# Patient Record
Sex: Female | Born: 1975 | Race: Black or African American | Hispanic: No | Marital: Single | Smoking: Never smoker
Health system: Southern US, Community
[De-identification: ages and names within clinical notes are randomized; demographics above are authoritative.]

## PROBLEM LIST (undated history)

## (undated) ENCOUNTER — Inpatient Hospital Stay (HOSPITAL_COMMUNITY): Payer: Self-pay

## (undated) DIAGNOSIS — M8580 Other specified disorders of bone density and structure, unspecified site: Secondary | ICD-10-CM

## (undated) DIAGNOSIS — Z98891 History of uterine scar from previous surgery: Secondary | ICD-10-CM

## (undated) DIAGNOSIS — J45909 Unspecified asthma, uncomplicated: Secondary | ICD-10-CM

## (undated) DIAGNOSIS — O4400 Placenta previa specified as without hemorrhage, unspecified trimester: Secondary | ICD-10-CM

## (undated) DIAGNOSIS — E559 Vitamin D deficiency, unspecified: Secondary | ICD-10-CM

## (undated) DIAGNOSIS — D649 Anemia, unspecified: Secondary | ICD-10-CM

## (undated) HISTORY — PX: DILATION AND CURETTAGE OF UTERUS: SHX78

---

## 2007-03-03 DIAGNOSIS — O1490 Unspecified pre-eclampsia, unspecified trimester: Secondary | ICD-10-CM

## 2008-09-10 ENCOUNTER — Ambulatory Visit: Payer: Self-pay | Admitting: Diagnostic Radiology

## 2008-09-10 ENCOUNTER — Emergency Department (HOSPITAL_BASED_OUTPATIENT_CLINIC_OR_DEPARTMENT_OTHER): Admission: EM | Admit: 2008-09-10 | Discharge: 2008-09-11 | Payer: Self-pay | Admitting: Emergency Medicine

## 2008-09-15 ENCOUNTER — Emergency Department (HOSPITAL_BASED_OUTPATIENT_CLINIC_OR_DEPARTMENT_OTHER): Admission: EM | Admit: 2008-09-15 | Discharge: 2008-09-15 | Payer: Self-pay | Admitting: Emergency Medicine

## 2008-09-15 ENCOUNTER — Ambulatory Visit: Payer: Self-pay | Admitting: Diagnostic Radiology

## 2009-04-23 ENCOUNTER — Ambulatory Visit (HOSPITAL_COMMUNITY): Admission: RE | Admit: 2009-04-23 | Discharge: 2009-04-23 | Payer: Self-pay | Admitting: Obstetrics and Gynecology

## 2010-06-08 LAB — GC/CHLAMYDIA PROBE AMP, GENITAL: GC Probe Amp, Genital: NEGATIVE

## 2010-06-08 LAB — CBC
Hemoglobin: 13.3 g/dL (ref 12.0–15.0)
MCHC: 34.1 g/dL (ref 30.0–36.0)
Platelets: 255 10*3/uL (ref 150–400)
RBC: 4.46 MIL/uL (ref 3.87–5.11)
RDW: 13.2 % (ref 11.5–15.5)

## 2010-06-08 LAB — BASIC METABOLIC PANEL
BUN: 13 mg/dL (ref 6–23)
CO2: 27 mEq/L (ref 19–32)
Chloride: 105 mEq/L (ref 96–112)
GFR calc Af Amer: >60 mL/min (ref >60)
Potassium: 3.5 mEq/L (ref 3.5–5.1)

## 2010-06-08 LAB — WET PREP, GENITAL
Trich, Wet Prep: NONE SEEN
Yeast Wet Prep HPF POC: NONE SEEN

## 2010-06-08 LAB — URINALYSIS, ROUTINE W REFLEX MICROSCOPIC
Glucose, UA: NEGATIVE mg/dL
Hgb urine dipstick: NEGATIVE
Protein, ur: NEGATIVE mg/dL
pH: 7.5 (ref 5.0–8.0)

## 2010-06-08 LAB — URINE MICROSCOPIC-ADD ON

## 2010-06-08 LAB — DIFFERENTIAL
Basophils Relative: 0 % (ref 0–1)
Lymphocytes Relative: 14 % (ref 12–46)
Monocytes Relative: 6 % (ref 3–12)
Neutrophils Relative %: 80 % — ABNORMAL HIGH (ref 43–77)

## 2011-08-26 LAB — OB RESULTS CONSOLE RPR: RPR: NONREACTIVE

## 2011-09-09 LAB — OB RESULTS CONSOLE GC/CHLAMYDIA: Chlamydia: NEGATIVE

## 2011-09-16 ENCOUNTER — Encounter (HOSPITAL_BASED_OUTPATIENT_CLINIC_OR_DEPARTMENT_OTHER): Payer: Self-pay | Admitting: Emergency Medicine

## 2011-09-16 ENCOUNTER — Emergency Department (HOSPITAL_BASED_OUTPATIENT_CLINIC_OR_DEPARTMENT_OTHER)
Admission: EM | Admit: 2011-09-16 | Discharge: 2011-09-17 | Disposition: A | Discharge status: Home / Self Care | Payer: Medicaid Other | Attending: Emergency Medicine | Admitting: Emergency Medicine

## 2011-09-16 DIAGNOSIS — O2690 Pregnancy related conditions, unspecified, unspecified trimester: Secondary | ICD-10-CM | POA: Insufficient documentation

## 2011-09-16 DIAGNOSIS — R109 Unspecified abdominal pain: Secondary | ICD-10-CM

## 2011-09-16 DIAGNOSIS — E860 Dehydration: Secondary | ICD-10-CM | POA: Insufficient documentation

## 2011-09-16 DIAGNOSIS — Z3490 Encounter for supervision of normal pregnancy, unspecified, unspecified trimester: Secondary | ICD-10-CM

## 2011-09-16 LAB — URINALYSIS, ROUTINE W REFLEX MICROSCOPIC
Bilirubin Urine: NEGATIVE
Leukocytes, UA: NEGATIVE
Nitrite: NEGATIVE
Protein, ur: NEGATIVE mg/dL

## 2011-09-16 LAB — CBC WITH DIFFERENTIAL/PLATELET
Basophils Absolute: 0 10*3/uL (ref 0.0–0.1)
Lymphocytes Relative: 19 % (ref 12–46)
MCHC: 34.9 g/dL (ref 30.0–36.0)
Neutrophils Relative %: 73 % (ref 43–77)
RDW: 14.2 % (ref 11.5–15.5)

## 2011-09-16 LAB — BASIC METABOLIC PANEL
Creatinine, Ser: 0.6 mg/dL (ref 0.50–1.10)
GFR calc Af Amer: >90 mL/min (ref >90)
Glucose, Bld: 90 mg/dL (ref 70–99)
Potassium: 4 mEq/L (ref 3.5–5.1)
Sodium: 136 mEq/L (ref 135–145)

## 2011-09-16 MED ORDER — SODIUM CHLORIDE 0.9 % IV BOLUS (SEPSIS)
1000.0000 mL | Freq: Once | INTRAVENOUS | Status: AC
  Administered 2011-09-16: 1000 mL via INTRAVENOUS

## 2011-09-16 MED ORDER — ACETAMINOPHEN 325 MG PO TABS
650.0000 mg | ORAL_TABLET | Freq: Once | ORAL | Status: AC
Start: 2011-09-16 — End: 2011-09-16
  Administered 2011-09-16: 650 mg via ORAL
  Filled 2011-09-16: qty 2

## 2011-09-16 NOTE — ED Notes (Signed)
MD at bedside. 

## 2011-09-16 NOTE — ED Notes (Addendum)
Pt c/o lower abd pain for few days, no urinary sxs noted. Pt states that she has had some spotting in the past but no spotting today. Pt denies any sexual intercourse. MD at bs to perform bs ultrasound.

## 2011-09-16 NOTE — ED Notes (Signed)
rec'd call from [place], RN at [facility] who suggested to continue to monitor pt on [facility] monitor and intermittently check for [facility]. Rapid response RN will continue to monitor and call with any changes.

## 2011-09-16 NOTE — ED Notes (Signed)
Pt c/o suprapubic pain. Pt has hx of placenta previa.

## 2011-09-16 NOTE — ED Notes (Signed)
Pt states she is 20 weeks and 1 day pregnant. Pt states she has been having this pain intermittently during her pregnancy and has been placed on pelvic rest by her OB. Pt state pain onset today started x 2 hours ago.

## 2011-09-16 NOTE — ED Notes (Signed)
Pt placed on fetal monitor, call placed to Lynden Ang, RN rapid response nurse at [facility] hospital who states they will monitor pt and notify of any changes.

## 2011-09-16 NOTE — ED Provider Notes (Addendum)
History     CSN: [id number]  Arrival date & time 09/16/11  2047   First MD Initiated Contact with Patient 09/16/11 2111      Chief Complaint  Patient presents with  . Abdominal Pain    (Consider location/radiation/quality/duration/timing/severity/associated sxs/prior treatment) Patient is a 36 y.o. female presenting with abdominal pain. The history is provided by the patient.  Abdominal Pain The primary symptoms of the illness include abdominal pain. The primary symptoms of the illness do not include fever, nausea or vomiting. Primary symptoms comment: Patient is [redacted] weeks pregnant with a history of placenta previa and developed severe intermittent suprapubic pain tonight for the last 3 hours. The current episode started 3 to 5 hours ago. The onset of the illness was sudden. The problem has not changed since onset. The abdominal pain is located in the suprapubic region. The abdominal pain does not radiate. The severity of the abdominal pain is 9/10. The abdominal pain is relieved by nothing. Exacerbated by: nothing.  Associated with: started while walking. Symptoms associated with the illness do not include urgency, frequency or back pain.    Past Medical History  Diagnosis Date  . Placenta previa     Past Surgical History  Procedure Date  . Dilation and curettage of uterus     No family history on file.  History  Substance Use Topics  . Smoking status: Never Smoker   . Smokeless tobacco: Not on file  . Alcohol Use: No    OB History    Grav Para Term Preterm Abortions TAB SAB Ect Mult Living   1               Review of Systems  Constitutional: Negative for fever.  Gastrointestinal: Positive for abdominal pain. Negative for nausea and vomiting.  Genitourinary: Negative for urgency and frequency.  Musculoskeletal: Negative for back pain.  All other systems reviewed and are negative.    Allergies  Morphine and related  Home Medications   Current Outpatient Rx    Name Route Sig Dispense Refill  . FOLIC ACID 400 MCG PO TABS Oral Take 400 mcg by mouth daily.    Marland Kitchen PRENATAL MULTIVITAMIN CH Oral Take 1 tablet by mouth daily.      BP 117/64  Pulse 84  Temp 98.1 F (36.7 C) (Oral)  Resp 18  SpO2 100%  LMP 04/28/2011  Physical Exam  Nursing note and vitals reviewed. Constitutional: She is oriented to person, place, and time. She appears well-developed and well-nourished. She appears distressed.  HENT:  Head: Normocephalic and atraumatic.  Eyes: EOM are normal. Pupils are equal, round, and reactive to light.  Cardiovascular: Normal rate, regular rhythm, normal heart sounds and intact distal pulses.  Exam reveals no friction rub.   No murmur heard. Pulmonary/Chest: Effort normal and breath sounds normal. She has no wheezes. She has no rales.  Abdominal: Soft. Bowel sounds are normal. She exhibits no distension. There is tenderness. There is guarding. There is no rebound.       Gravid uterus midway to the umbilicus with tenderness in the suprapubic area  Musculoskeletal: Normal range of motion. She exhibits no tenderness.       No edema  Neurological: She is alert and oriented to person, place, and time. No cranial nerve deficit.  Skin: Skin is warm and dry. No rash noted.  Psychiatric: She has a normal mood and affect. Her behavior is normal.    ED Course  Procedures (including critical care time)  Labs Reviewed  CBC WITH DIFFERENTIAL - Abnormal; Notable for the following:    WBC 13.4 (*)     RBC 3.67 (*)     Hemoglobin 10.9 (*)     HCT 31.2 (*)     Neutro Abs 9.9 (*)     All other components within normal limits  URINALYSIS, ROUTINE W REFLEX MICROSCOPIC  BASIC METABOLIC PANEL   No results found.   1. Abdominal  pain, other specified site   2. Pregnancy   3. Dehydration     EMERGENCY DEPARTMENT [place] PREGNANCY "Study: Limited Ultrasound of the Pelvis"  INDICATIONS:Pregnancy(required) and Pelvic pain Multiple views of the uterus  and pelvic cavity are obtained with a multi-frequency probe.  APPROACH:Transabdominal   PERFORMED BY: Myself  IMAGES ARCHIVED?: No  LIMITATIONS: none  PREGNANCY FREE FLUID: None  PREGNANCY UTERUS FINDINGS:Uterus enlarged ADNEXAL FINDINGS:  PREGNANCY FINDINGS: Fetal heart activity seen  INTERPRETATION: Viable intrauterine pregnancy  GESTATIONAL AGE, ESTIMATE: 20w  FETAL HEART RATE: 167  COMMENT(Estimate of Gestational Age):       MDM   Patient who is [redacted] weeks pregnant and has a history of placenta previa. She had mild bleeding yesterday but today while taking a walk she had intermittent contracting type pain in her lower abdomen. This has been ongoing now for 3 hours and is not improving. On exam patient appears uncomfortable. UA without signs of infection. Bedside ultrasound with normal fetal heart rates of 167 in fetal movement. Patient is O+. Will speak with her OB/GYN for further recommendations patient was started on IV fluids.  10:09 PM Spoke with ob and wanted to do fluids and supportive care.  Low concern due to no ctx on toco.  Pt had normal BMP, UA, and stable Hb on CBC.  11:44 PM Pt feeling better and pain has improved.  [facility] confirmed no contractions on toco.  Pt to f/u with OB in am.    Gwyneth Sprout, MD 09/16/11 2210  Gwyneth Sprout, MD 09/16/11 2345  Gwyneth Sprout, MD 09/16/11 2346

## 2011-11-04 ENCOUNTER — Encounter (HOSPITAL_COMMUNITY): Payer: Self-pay | Admitting: *Deleted

## 2011-11-04 ENCOUNTER — Inpatient Hospital Stay (HOSPITAL_COMMUNITY): Payer: Medicaid Other

## 2011-11-04 ENCOUNTER — Inpatient Hospital Stay (HOSPITAL_COMMUNITY)
Admission: AD | Admit: 2011-11-04 | Discharge: 2011-11-05 | DRG: 781 | Disposition: A | Discharge status: Home / Self Care | Payer: Medicaid Other | Source: Ambulatory Visit | Attending: Obstetrics and Gynecology | Admitting: Obstetrics and Gynecology

## 2011-11-04 DIAGNOSIS — O4400 Placenta previa specified as without hemorrhage, unspecified trimester: Secondary | ICD-10-CM | POA: Diagnosis present

## 2011-11-04 DIAGNOSIS — O99019 Anemia complicating pregnancy, unspecified trimester: Secondary | ICD-10-CM | POA: Diagnosis present

## 2011-11-04 DIAGNOSIS — O4700 False labor before 37 completed weeks of gestation, unspecified trimester: Secondary | ICD-10-CM | POA: Diagnosis present

## 2011-11-04 DIAGNOSIS — D649 Anemia, unspecified: Secondary | ICD-10-CM | POA: Diagnosis present

## 2011-11-04 HISTORY — DX: Unspecified asthma, uncomplicated: J45.909

## 2011-11-04 HISTORY — DX: Complete placenta previa nos or without hemorrhage, unspecified trimester: O44.00

## 2011-11-04 HISTORY — DX: Vitamin D deficiency, unspecified: E55.9

## 2011-11-04 LAB — TYPE AND SCREEN: ABO/RH(D): O POS

## 2011-11-04 LAB — URINALYSIS, ROUTINE W REFLEX MICROSCOPIC
Glucose, UA: NEGATIVE mg/dL
Leukocytes, UA: NEGATIVE
Nitrite: NEGATIVE
Protein, ur: NEGATIVE mg/dL
Specific Gravity, Urine: >1.03 — ABNORMAL HIGH (ref 1.005–1.030)
pH: 6 (ref 5.0–8.0)

## 2011-11-04 LAB — CBC
HCT: 34 % — ABNORMAL LOW (ref 36.0–46.0)
Platelets: 300 10*3/uL (ref 150–400)
WBC: 12.6 10*3/uL — ABNORMAL HIGH (ref 4.0–10.5)

## 2011-11-04 LAB — WET PREP, GENITAL: Clue Cells Wet Prep HPF POC: NONE SEEN

## 2011-11-04 LAB — ABO/RH: ABO/RH(D): O POS

## 2011-11-04 MED ORDER — SODIUM CHLORIDE 0.9 % IV SOLN: INTRAVENOUS | Status: DC

## 2011-11-04 MED ORDER — PRENATAL MULTIVITAMIN CH: 1.0000 | ORAL_TABLET | Freq: Every day | ORAL | Status: DC

## 2011-11-04 MED ORDER — DOCUSATE SODIUM 100 MG PO CAPS: 100.0000 mg | ORAL_CAPSULE | Freq: Every day | ORAL | Status: DC

## 2011-11-04 MED ORDER — TERBUTALINE SULFATE 1 MG/ML IJ SOLN
0.2500 mg | Freq: Once | INTRAMUSCULAR | Status: AC
  Administered 2011-11-04: 0.25 mg via SUBCUTANEOUS
  Filled 2011-11-04: qty 1

## 2011-11-04 MED ORDER — ACETAMINOPHEN 325 MG PO TABS: 650.0000 mg | ORAL_TABLET | ORAL | Status: DC | PRN

## 2011-11-04 MED ORDER — BETAMETHASONE SOD PHOS & ACET 6 (3-3) MG/ML IJ SUSP
12.0000 mg | INTRAMUSCULAR | Status: AC
  Administered 2011-11-04 – 2011-11-05 (×2): 12 mg via INTRAMUSCULAR
  Filled 2011-11-04 (×2): qty 2

## 2011-11-04 MED ORDER — ZOLPIDEM TARTRATE 5 MG PO TABS: 5.0000 mg | ORAL_TABLET | Freq: Every evening | ORAL | Status: DC | PRN

## 2011-11-04 MED ORDER — CALCIUM CARBONATE ANTACID 500 MG PO CHEW: 2.0000 | CHEWABLE_TABLET | ORAL | Status: DC | PRN

## 2011-11-04 MED ORDER — LACTATED RINGERS IV SOLN
INTRAVENOUS | Status: DC
  Administered 2011-11-04 – 2011-11-05 (×2): via INTRAVENOUS

## 2011-11-04 NOTE — Progress Notes (Signed)
Patient ID: [facility], female   DOB: 01-May-1975, 36 y.o.   MRN: [id number] 27 1/7 weeks with Complete Previa S: Comfortable now No contractions reported. No bleeding or LOF. Good FM O BP 116/64  Pulse 87  Temp 98.4 F (36.9 C) (Oral)  Resp 18  Ht 5\' 2"  (1.575 m)  Wt 74.844 kg (165 lb)  BMI 30.18 kg/m2  LMP 04/28/2011  FHT 120-130 with BTBV 5-25, 10x10 accels, rare mild variable decel to 120 Contractions rare with occ UI. A: Complete Previa at 27+ weeks. Preterm contractions with pos FFN No bleeding P: Monitor in-house until BMZ completed Tocolysis as needed.

## 2011-11-04 NOTE — Plan of Care (Signed)
Problem: Consults Goal: Birthing Suites Patient Information Press F2 to bring up selections list   Pt < [redacted] weeks EGA     

## 2011-11-04 NOTE — MAU Note (Signed)
Pt G3 P1 at 27.1wks with complete previa, possible acreta-has U/S on Friday.  Cramping this evening and pinkish spotting.

## 2011-11-04 NOTE — Progress Notes (Signed)
FFN obtained along with a wet prep.

## 2011-11-04 NOTE — H&P (Addendum)
Chief complaint: Contractions  History of present illness: This is a 36 year old G4 P1 0-1 at 74 and 1 who presents with regular uterine contractions for the past 3 hours. Patient notes no leaking fluid, no vaginal bleeding, active fetal movements. Patient states contractions began around midnight tonight and have been coming about every 5 minutes. Patient states pain from contractions is a 7-8/10. Patient does note some light pink tinged to her normal vaginal discharge. This has been occurring for the past several weeks.  Prenatal issues: -Placenta previa. Diagnosed at anatomy scan, followup ultrasound at 24 weeks, done for bleeding, revealed persistent placenta previa with clots at the os and some increased vascularity done at the level of the cervix. Followup ultrasounds was planned for later this week. Patient has been on modified bedrest and pelvic rest and states she has been compliant with this. 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  Medications: Prenatal vitamin  Allergies: Morphine  PE:  Filed Vitals:   11/04/11 0228  BP: 112/75  Pulse: 81  Temp: 96.8 F (36 C)  TempSrc: Oral  Resp: 20  Height: 5\' 2"  (1.575 m)  Weight: 74.844 kg (165 lb)   Cardiovascular: Regular rate and rhythm Pulmonary: Clear to auscultation Back: No costovertebral angle tenderness Abdomen: Gravid, no fundal tenderness, no right upper quadrant pain GU: Gentle speculum exam revealed cervix closed and long, scant white vaginal discharge, no digital exam performed Lower extremities: No edema, nontender  Toco: Irritability versus contractions every 4-5 minutes Fetal heart: 140s, +10 x 10 accelerations, no decelerations, moderate variability   UA: nit neg, ketones neg, leuks neg, sp grav  >1.030 CBC    Component Value Date/Time   WBC 12.6* 11/04/2011 0250   RBC 3.87 11/04/2011 0250   HGB 11.2* 11/04/2011 0250   HCT 34.0* 11/04/2011 0250   PLT 300 11/04/2011 0250   MCV 87.9 11/04/2011 0250   MCH 28.9 11/04/2011 0250   MCHC 32.9 11/04/2011 0250   RDW 14.4 11/04/2011 0250   LYMPHSABS 2.5 09/16/2011 2136   MONOABS 0.9 09/16/2011 2136   EOSABS 0.1 09/16/2011 2136   BASOSABS 0.0 09/16/2011 2136    wet prep, UCx, FFN pending  U/s: pending.  Assessment and plan: Is a 36 year old G4 P10 to 1 at 27 weeks and 1 day presenting with contractions and history concerning for placenta previa with possible accreta. - Contractions. Patient with history of term delivery, no history of cervical surgery. Patient at low risk for preterm labor. Will check fetal fibronectin. Given specific gravity of urine, patient likely dehydrated. Will see how she responds to IV fluids. Given symptomatic contractions will give one dose of terbutaline for comfort now. Will also assess cervical length by ultrasound. Patient has not yet had betamethasone. If evaluation concerning for preterm labor will likely keep inpatient for betamethasone and monitoring. - Concern for placenta previa with possible accreta. Has not been assessed in about 4 weeks. Will repeat ultrasound now. Patient's past obstetric history is not concerning for high risk of accreta or previa. -Fetal well being. Guarded due to early gestational age although fetal testing at current is reassuring.  Natalie Ross A. 11/04/2011 3:31 AM   MAU course: Pt received single dose of terbutaline and IV fluids. Contractions spaced then stopped and pt no longer feeling  contractions. U/s done (final report pending) showing persistent placenta previa but cvx 4 cm long (abdominal only). FFN returned as positive.   Given painful contractions (now gone) and FFN positive, will admit pt for further observation and administration of BMZ. If no further ctx on monitor and no c/o  complaint of bleeding, can consider d/c in 24 hrs after receiving her 2nd dose BMZ..If pt starts contracting again will likely plan nifedipine tocolysis. If bleeding heavily or delivery imminent will plan Magnesium for neuroprotection.    Persistent placenta previa. No significant bleeding at this time.  GBS cx not done as MOD will be c/s at this time  Plan GTT in 1 wk  Frederich Montilla A. 11/04/2011 6:56 AM

## 2011-11-05 ENCOUNTER — Encounter (HOSPITAL_COMMUNITY): Payer: Self-pay

## 2011-11-05 DIAGNOSIS — O4400 Placenta previa specified as without hemorrhage, unspecified trimester: Secondary | ICD-10-CM

## 2011-11-05 HISTORY — DX: Complete placenta previa nos or without hemorrhage, unspecified trimester: O44.00

## 2011-11-05 LAB — URINE CULTURE: Colony Count: 25000

## 2011-11-05 LAB — CBC
Platelets: 254 10*3/uL (ref 150–400)
RDW: 14.5 % (ref 11.5–15.5)
WBC: 15.4 10*3/uL — ABNORMAL HIGH (ref 4.0–10.5)

## 2011-11-05 NOTE — Progress Notes (Signed)
Patient ID: [facility], female   DOB: October 23, 1975, 36 y.o.   MRN: [id number] HD #1 S: Occ contractions in middle of night. No bleeding. Good FM. No LOF. No CP or SOB. No history of PTL or PTD  O: BP 116/78  Pulse 86  Temp 98.1 F (36.7 C) (Oral)  Resp 18  Ht 5\' 2"  (1.575 m)  Wt 74.844 kg (165 lb)  BMI 30.18 kg/m2  LMP 04/28/2011  NCAT Lungs: CTA CV: RRR ABD: soft, gravid and non tender No CVAT EXT: neg c/c/e Neuro: nonfocal Skin: intact Pelvic : deferred  CBC    Component Value Date/Time   WBC 15.4* 11/05/2011 0440   RBC 3.09* 11/05/2011 0440   HGB 9.1* 11/05/2011 0440   HCT 27.1* 11/05/2011 0440   PLT 254 11/05/2011 0440   MCV 87.7 11/05/2011 0440   MCH 29.4 11/05/2011 0440   MCHC 33.6 11/05/2011 0440   RDW 14.5 11/05/2011 0440   LYMPHSABS 2.5 09/16/2011 2136   MONOABS 0.9 09/16/2011 2136   EOSABS 0.1 09/16/2011 2136   BASOSABS 0.0 09/16/2011 2136    FHT 150s with BTBV 5-25, 10x10 accels and no decels Rare contractions noted. Category 1 tracing  A: 27 2/7 week IUP Complete Previa- no bleeding Preterm contractions with pos FFN - No evidence of PTL. BMZ completed. Anemia  P: Recommend OTC Fe(will give samples tomorrow) DC home Bedrest with BRP PTL precautions Fu with Dr. Cherly Hensen tomorrow. Pt lives within of hospital.

## 2011-12-02 NOTE — Progress Notes (Signed)
Post discharge review completed. 

## 2011-12-05 ENCOUNTER — Encounter (HOSPITAL_COMMUNITY): Payer: Self-pay | Admitting: Obstetrics and Gynecology

## 2011-12-05 ENCOUNTER — Inpatient Hospital Stay (HOSPITAL_COMMUNITY)
Admission: AD | Admit: 2011-12-05 | Discharge: 2011-12-05 | Disposition: A | Discharge status: Home / Self Care | Payer: Medicaid Other | Source: Ambulatory Visit | Attending: Obstetrics and Gynecology | Admitting: Obstetrics and Gynecology

## 2011-12-05 DIAGNOSIS — E860 Dehydration: Secondary | ICD-10-CM | POA: Insufficient documentation

## 2011-12-05 DIAGNOSIS — O4410 Placenta previa with hemorrhage, unspecified trimester: Secondary | ICD-10-CM | POA: Insufficient documentation

## 2011-12-05 DIAGNOSIS — O4400 Placenta previa specified as without hemorrhage, unspecified trimester: Secondary | ICD-10-CM

## 2011-12-05 LAB — URINALYSIS, ROUTINE W REFLEX MICROSCOPIC
Bilirubin Urine: NEGATIVE
Glucose, UA: NEGATIVE mg/dL
Specific Gravity, Urine: >1.03 — ABNORMAL HIGH (ref 1.005–1.030)
pH: 6 (ref 5.0–8.0)

## 2011-12-05 LAB — URINE MICROSCOPIC-ADD ON

## 2011-12-05 NOTE — MAU Note (Signed)
Pt presents to MAU with chief complaint of brown vaginal spotting. Pt is [redacted]w[redacted]d- and has a placenta previa; has been on pelvic rest.

## 2011-12-05 NOTE — MAU Provider Note (Signed)
History   [facility] is a [id number] at [redacted]w[redacted]d with known placenta previa. C/O brown spotting started this am and pelvic pressure. Had run of contractions last night and drank some water, ctx subsided and today she has felt occasional ctx, 1-2 / hr. Minimal water intake today.  Reports good fetal movement. Ongoing pelvic rest since dx.   Chief Complaint  Patient presents with  . Vaginal Bleeding    OB History    Grav Para Term Preterm Abortions TAB SAB Ect Mult Living   4 1   2 1 1   1       Past Medical History  Diagnosis Date  . Placenta previa   . Vitamin d deficiency   . Asthma   . Placenta previa without hemorrhage 11/05/2011    Past Surgical History  Procedure Date  . Dilation and curettage of uterus     Family History  Problem Relation Age of Onset  . Diabetes Father   . Vision loss Father   . Stroke Maternal Aunt   . Cancer Paternal Aunt   . Cancer Paternal Uncle   . Hypertension Maternal Grandmother   . Diabetes Maternal Grandmother   . Heart disease Maternal Grandmother   . Arthritis Maternal Grandmother   . Hypertension Paternal Grandmother   . Diabetes Paternal Grandmother   . Arthritis Paternal Grandmother   . Asthma Paternal Grandfather     History  Substance Use Topics  . Smoking status: Never Smoker   . Smokeless tobacco: Not on file  . Alcohol Use: No    Allergies:  Allergies  Allergen Reactions  . Morphine And Related Other (See Comments)    Pt reported trouble breathing    Prescriptions prior to admission  Medication Sig Dispense Refill  . albuterol (PROVENTIL HFA;VENTOLIN HFA) 108 (90 BASE) MCG/ACT inhaler Inhale 2 puffs into the lungs every 6 (six) hours as needed. Asthma, SOB      . Iron-FA-B Cmp-C-Biot-Probiotic (FUSION PLUS PO) Take 1 tablet by mouth at bedtime.      . Prenatal Vit-Fe Fumarate-FA (PRENATAL MULTIVITAMIN) TABS Take 1 tablet by mouth every morning.        ROS: no N/V, no abdominal pain, no LOF Physical Exam      Physical Exam: Blood pressure 114/62, pulse 88, temperature 98.5 F (36.9 C), temperature source Oral, resp. rate 18, last menstrual period 04/28/2011.  General: NAD Abd: Soft, NT, no ctx palp  Ext: no edema Neuro: DTRs normal Pelvic: SE done, small amount brown/dark red mucous debris cleared w/ fox swab, cervix appears slightly open, no active bleed Gentle digital exam 1-2/60/-2  FHR: 145, moderate variability, no decel's, + acel's TOCO: occasional irritability / small 30-40 sec ctx   ED Course   Results for orders placed during the hospital encounter of 12/05/11 (from the past 24 hour(s))  URINALYSIS, ROUTINE W REFLEX MICROSCOPIC     Status: Abnormal   Collection Time   12/05/11  2:44 PM      Component Value Range   Color, Urine AMBER (*) YELLOW   APPearance CLEAR  CLEAR   Specific Gravity, Urine >1.030 (*) 1.005 - 1.030   pH 6.0  5.0 - 8.0   Glucose, UA NEGATIVE  NEGATIVE mg/dL   Hgb urine dipstick LARGE (*) NEGATIVE   Bilirubin Urine NEGATIVE  NEGATIVE   Ketones, ur NEGATIVE  NEGATIVE mg/dL   Protein, ur NEGATIVE  NEGATIVE mg/dL   Urobilinogen, UA 1.0  0.0 - 1.0 mg/dL   Nitrite  NEGATIVE  NEGATIVE   Leukocytes, UA NEGATIVE  NEGATIVE  URINE MICROSCOPIC-ADD ON     Status: Abnormal   Collection Time   12/05/11  2:44 PM      Component Value Range   Squamous Epithelial / LPF MANY (*) RARE   RBC / HPF TOO NUMEROUS TO COUNT  <3 RBC/hpf   Bacteria, UA MANY (*) RARE   Urine-Other MUCOUS PRESENT     A/P: IUP at [redacted]w[redacted]d, known previa Reassuring fetal status No active bleed Mild dehydration  Will give PO hydration DC home after 1 L water intake F/U w/ scheduled visit 12/11/2011 Continue previa precautions.  Consult Dr. Cherly Hensen, agrees  [facility]  12/05/2011 4:28 PM

## 2011-12-28 ENCOUNTER — Other Ambulatory Visit (HOSPITAL_COMMUNITY): Payer: Self-pay | Admitting: Obstetrics and Gynecology

## 2011-12-28 ENCOUNTER — Encounter (HOSPITAL_COMMUNITY): Payer: Self-pay | Admitting: Pharmacist

## 2011-12-28 DIAGNOSIS — O4400 Placenta previa specified as without hemorrhage, unspecified trimester: Secondary | ICD-10-CM

## 2011-12-30 ENCOUNTER — Other Ambulatory Visit (HOSPITAL_COMMUNITY): Payer: Self-pay | Admitting: Obstetrics and Gynecology

## 2011-12-30 ENCOUNTER — Ambulatory Visit (HOSPITAL_COMMUNITY)
Admission: RE | Admit: 2011-12-30 | Discharge: 2011-12-30 | Disposition: A | Discharge status: Home / Self Care | Payer: Medicaid Other | Source: Ambulatory Visit | Attending: Obstetrics and Gynecology | Admitting: Obstetrics and Gynecology

## 2011-12-30 DIAGNOSIS — O4400 Placenta previa specified as without hemorrhage, unspecified trimester: Secondary | ICD-10-CM

## 2012-01-04 ENCOUNTER — Encounter (HOSPITAL_COMMUNITY)
Admission: RE | Admit: 2012-01-04 | Discharge: 2012-01-04 | Disposition: A | Discharge status: Home / Self Care | Payer: Medicaid Other | Source: Ambulatory Visit | Attending: Obstetrics and Gynecology | Admitting: Obstetrics and Gynecology

## 2012-01-04 ENCOUNTER — Encounter (HOSPITAL_COMMUNITY): Payer: Self-pay

## 2012-01-04 VITALS — BP 119/77 | Ht 62.0 in | Wt 180.0 lb

## 2012-01-04 DIAGNOSIS — O4400 Placenta previa specified as without hemorrhage, unspecified trimester: Secondary | ICD-10-CM

## 2012-01-04 HISTORY — DX: Other specified disorders of bone density and structure, unspecified site: M85.80

## 2012-01-04 LAB — TYPE AND SCREEN
ABO/RH(D): O POS
Antibody Screen: NEGATIVE

## 2012-01-04 LAB — CBC
MCV: 87.1 fL (ref 78.0–100.0)
Platelets: 259 10*3/uL (ref 150–400)
RDW: 14.8 % (ref 11.5–15.5)
WBC: 12.2 10*3/uL — ABNORMAL HIGH (ref 4.0–10.5)

## 2012-01-04 LAB — RPR: RPR Ser Ql: NONREACTIVE

## 2012-01-04 NOTE — Pre-Procedure Instructions (Signed)
4 units PRBCs on hold for patient per order Dana Allan, M.D due to placenta previa and possible accreta. Confirmed with Sharlyne Cai in Blood Bank that Plasma available if needed, but can not be ordered until actually ready to transfuse.

## 2012-01-04 NOTE — Patient Instructions (Addendum)
[address] [facility]  01/04/2012   Your procedure is scheduled on:  01/12/12  Enter through the Main Entrance of [facility] at 6 AM.  Pick up the phone at the desk and dial [phone].   Call this number if you have problems the morning of surgery: [phone]   Remember:   Do not eat food:After Midnight.  Do not drink clear liquids: After Midnight.  Take these medicines the morning of surgery with A SIP OF WATER: NA   Do not wear jewelry, make-up or nail polish.  Do not wear lotions, powders, or perfumes. You may wear deodorant.  Do not shave 48 hours prior to surgery.  Do not bring valuables to the hospital.  Contacts, dentures or bridgework may not be worn into surgery.  Leave suitcase in the car. After surgery it may be brought to your room.  For patients admitted to the hospital, checkout time is 11:00 AM the day of discharge.   Patients discharged the day of surgery will not be allowed to drive home.  Name and phone number of your driver: NA  Special Instructions: Shower using CHG 2 nights before surgery and the night before surgery.  If you shower the day of surgery use CHG.  Use special wash - you have one bottle of CHG for all showers.  You should use approximately 1/3 of the bottle for each shower.   Please read over the following fact sheets that you were given: MRSA Information

## 2012-01-08 ENCOUNTER — Other Ambulatory Visit: Payer: Self-pay | Admitting: Obstetrics and Gynecology

## 2012-01-12 ENCOUNTER — Inpatient Hospital Stay (HOSPITAL_COMMUNITY): Payer: Medicaid Other | Admitting: Anesthesiology

## 2012-01-12 ENCOUNTER — Inpatient Hospital Stay (HOSPITAL_COMMUNITY)
Admission: RE | Admit: 2012-01-12 | Discharge: 2012-01-15 | DRG: 766 | Disposition: A | Discharge status: Home / Self Care | Payer: Medicaid Other | Source: Ambulatory Visit | Attending: Obstetrics and Gynecology | Admitting: Obstetrics and Gynecology

## 2012-01-12 ENCOUNTER — Encounter (HOSPITAL_COMMUNITY): Payer: Self-pay | Admitting: *Deleted

## 2012-01-12 ENCOUNTER — Encounter (HOSPITAL_COMMUNITY)
Admission: RE | Disposition: A | Discharge status: Home / Self Care | Payer: Self-pay | Source: Ambulatory Visit | Attending: Obstetrics and Gynecology

## 2012-01-12 ENCOUNTER — Encounter (HOSPITAL_COMMUNITY): Payer: Self-pay | Admitting: Anesthesiology

## 2012-01-12 DIAGNOSIS — O4410 Placenta previa with hemorrhage, unspecified trimester: Principal | ICD-10-CM | POA: Diagnosis present

## 2012-01-12 DIAGNOSIS — Z98891 History of uterine scar from previous surgery: Secondary | ICD-10-CM

## 2012-01-12 DIAGNOSIS — D649 Anemia, unspecified: Secondary | ICD-10-CM | POA: Diagnosis present

## 2012-01-12 DIAGNOSIS — O4400 Placenta previa specified as without hemorrhage, unspecified trimester: Secondary | ICD-10-CM | POA: Diagnosis present

## 2012-01-12 HISTORY — DX: History of uterine scar from previous surgery: Z98.891

## 2012-01-12 HISTORY — DX: Anemia, unspecified: D64.9

## 2012-01-12 LAB — PREPARE RBC (CROSSMATCH)

## 2012-01-12 SURGERY — Surgical Case
Anesthesia: Spinal | Site: Abdomen | Wound class: Clean Contaminated

## 2012-01-12 MED ORDER — BISACODYL 10 MG RE SUPP: 10.0000 mg | Freq: Every day | RECTAL | Status: DC | PRN

## 2012-01-12 MED ORDER — KETOROLAC TROMETHAMINE 30 MG/ML IJ SOLN: 30.0000 mg | Freq: Four times a day (QID) | INTRAMUSCULAR | Status: AC | PRN

## 2012-01-12 MED ORDER — METHYLERGONOVINE MALEATE 0.2 MG PO TABS: 0.2000 mg | ORAL_TABLET | ORAL | Status: DC | PRN

## 2012-01-12 MED ORDER — FENTANYL CITRATE 0.05 MG/ML IJ SOLN
INTRAMUSCULAR | Status: AC
  Filled 2012-01-12: qty 2

## 2012-01-12 MED ORDER — LACTATED RINGERS IV SOLN: INTRAVENOUS | Status: DC | PRN

## 2012-01-12 MED ORDER — BUPIVACAINE HCL (PF) 0.25 % IJ SOLN
INTRAMUSCULAR | Status: AC
  Filled 2012-01-12: qty 30

## 2012-01-12 MED ORDER — METOCLOPRAMIDE HCL 5 MG/ML IJ SOLN: 10.0000 mg | Freq: Three times a day (TID) | INTRAMUSCULAR | Status: DC | PRN

## 2012-01-12 MED ORDER — SODIUM CHLORIDE 0.9 % IJ SOLN: 3.0000 mL | INTRAMUSCULAR | Status: DC | PRN

## 2012-01-12 MED ORDER — CEFAZOLIN SODIUM-DEXTROSE 2-3 GM-% IV SOLR
2.0000 g | INTRAVENOUS | Status: AC
  Administered 2012-01-12: 2 g via INTRAVENOUS

## 2012-01-12 MED ORDER — SODIUM CHLORIDE 0.9 % IV SOLN
1.0000 ug/kg/h | INTRAVENOUS | Status: DC | PRN
  Filled 2012-01-12: qty 2.5

## 2012-01-12 MED ORDER — MEPERIDINE HCL 25 MG/ML IJ SOLN: 6.2500 mg | INTRAMUSCULAR | Status: DC | PRN

## 2012-01-12 MED ORDER — DIPHENHYDRAMINE HCL 50 MG/ML IJ SOLN: 12.5000 mg | INTRAMUSCULAR | Status: DC | PRN

## 2012-01-12 MED ORDER — NALOXONE HCL 0.4 MG/ML IJ SOLN: 0.4000 mg | INTRAMUSCULAR | Status: DC | PRN

## 2012-01-12 MED ORDER — METHYLERGONOVINE MALEATE 0.2 MG/ML IJ SOLN: 0.2000 mg | INTRAMUSCULAR | Status: DC | PRN

## 2012-01-12 MED ORDER — IBUPROFEN 600 MG PO TABS
600.0000 mg | ORAL_TABLET | Freq: Four times a day (QID) | ORAL | Status: DC
  Administered 2012-01-12 – 2012-01-15 (×12): 600 mg via ORAL
  Filled 2012-01-12 (×12): qty 1

## 2012-01-12 MED ORDER — KETOROLAC TROMETHAMINE 60 MG/2ML IM SOLN
INTRAMUSCULAR | Status: AC
  Administered 2012-01-12: 60 mg via INTRAMUSCULAR
  Filled 2012-01-12: qty 2

## 2012-01-12 MED ORDER — MENTHOL 3 MG MT LOZG: 1.0000 | LOZENGE | OROMUCOSAL | Status: DC | PRN

## 2012-01-12 MED ORDER — SCOPOLAMINE 1 MG/3DAYS TD PT72: 1.0000 | MEDICATED_PATCH | Freq: Once | TRANSDERMAL | Status: DC

## 2012-01-12 MED ORDER — OXYTOCIN 10 UNIT/ML IJ SOLN
40.0000 [IU] | INTRAVENOUS | Status: DC | PRN
  Administered 2012-01-12: 40 [IU] via INTRAVENOUS

## 2012-01-12 MED ORDER — PNEUMOCOCCAL VAC POLYVALENT 25 MCG/0.5ML IJ INJ
0.5000 mL | INJECTION | INTRAMUSCULAR | Status: AC
  Administered 2012-01-13: 0.5 mL via INTRAMUSCULAR
  Filled 2012-01-12: qty 0.5

## 2012-01-12 MED ORDER — CEFAZOLIN SODIUM-DEXTROSE 2-3 GM-% IV SOLR
INTRAVENOUS | Status: AC
  Filled 2012-01-12: qty 50

## 2012-01-12 MED ORDER — ZOLPIDEM TARTRATE 5 MG PO TABS: 5.0000 mg | ORAL_TABLET | Freq: Every evening | ORAL | Status: DC | PRN

## 2012-01-12 MED ORDER — WITCH HAZEL-GLYCERIN EX PADS: 1.0000 "application " | MEDICATED_PAD | CUTANEOUS | Status: DC | PRN

## 2012-01-12 MED ORDER — SODIUM CHLORIDE 0.9 % IV SOLN: 250.0000 mL | INTRAVENOUS | Status: DC

## 2012-01-12 MED ORDER — ONDANSETRON HCL 4 MG/2ML IJ SOLN: 4.0000 mg | INTRAMUSCULAR | Status: DC | PRN

## 2012-01-12 MED ORDER — SODIUM CHLORIDE 0.9 % IJ SOLN
3.0000 mL | Freq: Two times a day (BID) | INTRAMUSCULAR | Status: DC
  Administered 2012-01-13: 3 mL via INTRAVENOUS

## 2012-01-12 MED ORDER — EPHEDRINE 5 MG/ML INJ
INTRAVENOUS | Status: AC
  Filled 2012-01-12: qty 10

## 2012-01-12 MED ORDER — MORPHINE SULFATE (PF) 0.5 MG/ML IJ SOLN
INTRAMUSCULAR | Status: DC | PRN
  Administered 2012-01-12: .2 mg via INTRATHECAL

## 2012-01-12 MED ORDER — ONDANSETRON HCL 4 MG/2ML IJ SOLN: 4.0000 mg | Freq: Three times a day (TID) | INTRAMUSCULAR | Status: DC | PRN

## 2012-01-12 MED ORDER — SIMETHICONE 80 MG PO CHEW
80.0000 mg | CHEWABLE_TABLET | Freq: Three times a day (TID) | ORAL | Status: DC
  Administered 2012-01-12 – 2012-01-15 (×10): 80 mg via ORAL

## 2012-01-12 MED ORDER — FERROUS SULFATE 325 (65 FE) MG PO TABS
325.0000 mg | ORAL_TABLET | Freq: Two times a day (BID) | ORAL | Status: DC
  Administered 2012-01-12 – 2012-01-13 (×2): 325 mg via ORAL
  Filled 2012-01-12 (×2): qty 1

## 2012-01-12 MED ORDER — DIPHENHYDRAMINE HCL 25 MG PO CAPS
25.0000 mg | ORAL_CAPSULE | ORAL | Status: DC | PRN
  Filled 2012-01-12: qty 1

## 2012-01-12 MED ORDER — OXYTOCIN 10 UNIT/ML IJ SOLN
INTRAMUSCULAR | Status: AC
  Filled 2012-01-12: qty 4

## 2012-01-12 MED ORDER — SCOPOLAMINE 1 MG/3DAYS TD PT72
MEDICATED_PATCH | TRANSDERMAL | Status: AC
  Administered 2012-01-12: 1.5 mg via TRANSDERMAL
  Filled 2012-01-12: qty 1

## 2012-01-12 MED ORDER — DIPHENHYDRAMINE HCL 50 MG/ML IJ SOLN: 25.0000 mg | INTRAMUSCULAR | Status: DC | PRN

## 2012-01-12 MED ORDER — FLEET ENEMA 7-19 GM/118ML RE ENEM: 1.0000 | ENEMA | Freq: Every day | RECTAL | Status: DC | PRN

## 2012-01-12 MED ORDER — KETOROLAC TROMETHAMINE 30 MG/ML IJ SOLN: 15.0000 mg | Freq: Once | INTRAMUSCULAR | Status: DC | PRN

## 2012-01-12 MED ORDER — PRENATAL MULTIVITAMIN CH
1.0000 | ORAL_TABLET | Freq: Every day | ORAL | Status: DC
  Administered 2012-01-12 – 2012-01-15 (×4): 1 via ORAL
  Filled 2012-01-12 (×3): qty 1

## 2012-01-12 MED ORDER — PHENYLEPHRINE HCL 10 MG/ML IJ SOLN
INTRAMUSCULAR | Status: DC | PRN
  Administered 2012-01-12: 40 ug via INTRAVENOUS
  Administered 2012-01-12: 80 ug via INTRAVENOUS
  Administered 2012-01-12 (×3): 40 ug via INTRAVENOUS
  Administered 2012-01-12: 80 ug via INTRAVENOUS
  Administered 2012-01-12 (×2): 40 ug via INTRAVENOUS

## 2012-01-12 MED ORDER — ONDANSETRON HCL 4 MG/2ML IJ SOLN
INTRAMUSCULAR | Status: AC
  Filled 2012-01-12: qty 2

## 2012-01-12 MED ORDER — SIMETHICONE 80 MG PO CHEW: 80.0000 mg | CHEWABLE_TABLET | ORAL | Status: DC | PRN

## 2012-01-12 MED ORDER — TETANUS-DIPHTH-ACELL PERTUSSIS 5-2.5-18.5 LF-MCG/0.5 IM SUSP
0.5000 mL | Freq: Once | INTRAMUSCULAR | Status: AC
  Administered 2012-01-13: 0.5 mL via INTRAMUSCULAR
  Filled 2012-01-12: qty 0.5

## 2012-01-12 MED ORDER — ALBUTEROL SULFATE HFA 108 (90 BASE) MCG/ACT IN AERS: 2.0000 | INHALATION_SPRAY | Freq: Four times a day (QID) | RESPIRATORY_TRACT | Status: DC | PRN

## 2012-01-12 MED ORDER — BUPIVACAINE HCL (PF) 0.25 % IJ SOLN
INTRAMUSCULAR | Status: DC | PRN
  Administered 2012-01-12: 6 mL

## 2012-01-12 MED ORDER — BUPIVACAINE IN DEXTROSE 0.75-8.25 % IT SOLN
INTRATHECAL | Status: DC | PRN
  Administered 2012-01-12: 1.4 mL via INTRATHECAL

## 2012-01-12 MED ORDER — OXYCODONE-ACETAMINOPHEN 5-325 MG PO TABS
1.0000 | ORAL_TABLET | ORAL | Status: DC | PRN
  Administered 2012-01-13 – 2012-01-14 (×2): 2 via ORAL
  Administered 2012-01-14: 1 via ORAL
  Administered 2012-01-15: 2 via ORAL
  Filled 2012-01-12 (×2): qty 2
  Filled 2012-01-12: qty 1
  Filled 2012-01-12: qty 2

## 2012-01-12 MED ORDER — LANOLIN HYDROUS EX OINT: 1.0000 "application " | TOPICAL_OINTMENT | CUTANEOUS | Status: DC | PRN

## 2012-01-12 MED ORDER — SENNOSIDES-DOCUSATE SODIUM 8.6-50 MG PO TABS
2.0000 | ORAL_TABLET | Freq: Every day | ORAL | Status: DC
  Administered 2012-01-12 – 2012-01-14 (×3): 2 via ORAL

## 2012-01-12 MED ORDER — PROMETHAZINE HCL 25 MG/ML IJ SOLN: 6.2500 mg | INTRAMUSCULAR | Status: DC | PRN

## 2012-01-12 MED ORDER — FENTANYL CITRATE 0.05 MG/ML IJ SOLN
INTRAMUSCULAR | Status: AC
  Administered 2012-01-12: 50 ug via INTRAVENOUS
  Filled 2012-01-12: qty 2

## 2012-01-12 MED ORDER — MORPHINE SULFATE 0.5 MG/ML IJ SOLN
INTRAMUSCULAR | Status: AC
  Filled 2012-01-12: qty 10

## 2012-01-12 MED ORDER — PHENYLEPHRINE 40 MCG/ML (10ML) SYRINGE FOR IV PUSH (FOR BLOOD PRESSURE SUPPORT)
PREFILLED_SYRINGE | INTRAVENOUS | Status: AC
  Filled 2012-01-12: qty 10

## 2012-01-12 MED ORDER — FENTANYL CITRATE 0.05 MG/ML IJ SOLN
25.0000 ug | INTRAMUSCULAR | Status: DC | PRN
  Administered 2012-01-12: 50 ug via INTRAVENOUS

## 2012-01-12 MED ORDER — ONDANSETRON HCL 4 MG/2ML IJ SOLN
INTRAMUSCULAR | Status: DC | PRN
  Administered 2012-01-12: 4 mg via INTRAVENOUS

## 2012-01-12 MED ORDER — EPHEDRINE SULFATE 50 MG/ML IJ SOLN
INTRAMUSCULAR | Status: DC | PRN
  Administered 2012-01-12: 10 mg via INTRAVENOUS
  Administered 2012-01-12: 15 mg via INTRAVENOUS
  Administered 2012-01-12: 10 mg via INTRAVENOUS
  Administered 2012-01-12: 5 mg via INTRAVENOUS
  Administered 2012-01-12: 10 mg via INTRAVENOUS

## 2012-01-12 MED ORDER — SCOPOLAMINE 1 MG/3DAYS TD PT72
1.0000 | MEDICATED_PATCH | Freq: Once | TRANSDERMAL | Status: DC
  Administered 2012-01-12: 1.5 mg via TRANSDERMAL

## 2012-01-12 MED ORDER — DIBUCAINE 1 % RE OINT: 1.0000 "application " | TOPICAL_OINTMENT | RECTAL | Status: DC | PRN

## 2012-01-12 MED ORDER — FENTANYL CITRATE 0.05 MG/ML IJ SOLN
INTRAMUSCULAR | Status: DC | PRN
  Administered 2012-01-12: 12.5 ug via INTRATHECAL

## 2012-01-12 MED ORDER — ONDANSETRON HCL 4 MG PO TABS: 4.0000 mg | ORAL_TABLET | ORAL | Status: DC | PRN

## 2012-01-12 MED ORDER — LACTATED RINGERS IV SOLN
Freq: Once | INTRAVENOUS | Status: AC
  Administered 2012-01-12 (×3): via INTRAVENOUS

## 2012-01-12 MED ORDER — DIPHENHYDRAMINE HCL 25 MG PO CAPS: 25.0000 mg | ORAL_CAPSULE | Freq: Four times a day (QID) | ORAL | Status: DC | PRN

## 2012-01-12 MED ORDER — KETOROLAC TROMETHAMINE 60 MG/2ML IM SOLN
60.0000 mg | Freq: Once | INTRAMUSCULAR | Status: AC | PRN
  Administered 2012-01-12: 60 mg via INTRAMUSCULAR

## 2012-01-12 MED ORDER — CEFAZOLIN SODIUM 1-5 GM-% IV SOLN
1.0000 g | Freq: Three times a day (TID) | INTRAVENOUS | Status: AC
  Administered 2012-01-12 (×2): 1 g via INTRAVENOUS
  Filled 2012-01-12 (×2): qty 50

## 2012-01-12 MED ORDER — OXYTOCIN 40 UNITS IN LACTATED RINGERS INFUSION - SIMPLE MED: 62.5000 mL/h | INTRAVENOUS | Status: AC

## 2012-01-12 SURGICAL SUPPLY — 44 items
BARRIER ADHS 3X4 INTERCEED (GAUZE/BANDAGES/DRESSINGS) ×2 IMPLANT
BENZOIN TINCTURE PRP APPL 2/3 (GAUZE/BANDAGES/DRESSINGS) IMPLANT
CLOTH BEACON ORANGE TIMEOUT ST (SAFETY) ×2 IMPLANT
CONTAINER PREFILL 10% NBF 15ML (MISCELLANEOUS) IMPLANT
DRAPE SURG 17X23 STRL (DRAPES) IMPLANT
DRESSING TELFA 8X3 (GAUZE/BANDAGES/DRESSINGS) ×2 IMPLANT
DRSG COVADERM 4X10 (GAUZE/BANDAGES/DRESSINGS) ×4 IMPLANT
DURAPREP 26ML APPLICATOR (WOUND CARE) ×2 IMPLANT
ELECT REM PT RETURN 9FT ADLT (ELECTROSURGICAL) ×2
ELECTRODE REM PT RTRN 9FT ADLT (ELECTROSURGICAL) ×1 IMPLANT
EXTRACTOR VACUUM KIWI (MISCELLANEOUS) IMPLANT
EXTRACTOR VACUUM M CUP 4 TUBE (SUCTIONS) IMPLANT
GAUZE SPONGE 4X4 12PLY STRL LF (GAUZE/BANDAGES/DRESSINGS) ×4 IMPLANT
GLOVE BIO SURGEON STRL SZ 6.5 (GLOVE) ×2 IMPLANT
GLOVE BIOGEL PI IND STRL 7.0 (GLOVE) ×2 IMPLANT
GLOVE BIOGEL PI INDICATOR 7.0 (GLOVE) ×2
GOWN PREVENTION PLUS LG XLONG (DISPOSABLE) ×6 IMPLANT
KIT ABG SYR 3ML LUER SLIP (SYRINGE) IMPLANT
NEEDLE HYPO 25X1 1.5 SAFETY (NEEDLE) ×2 IMPLANT
NEEDLE HYPO 25X5/8 SAFETYGLIDE (NEEDLE) ×2 IMPLANT
NS IRRIG 1000ML POUR BTL (IV SOLUTION) ×2 IMPLANT
PACK C SECTION WH (CUSTOM PROCEDURE TRAY) ×2 IMPLANT
PAD ABD 7.5X8 STRL (GAUZE/BANDAGES/DRESSINGS) ×2 IMPLANT
PAD OB MATERNITY 4.3X12.25 (PERSONAL CARE ITEMS) ×2 IMPLANT
RTRCTR C-SECT PINK 25CM LRG (MISCELLANEOUS) IMPLANT
SLEEVE SCD COMPRESS KNEE MED (MISCELLANEOUS) IMPLANT
SPONGE GAUZE 4X4 12PLY (GAUZE/BANDAGES/DRESSINGS) ×2 IMPLANT
STAPLER VISISTAT 35W (STAPLE) IMPLANT
STRIP CLOSURE SKIN 1/2X4 (GAUZE/BANDAGES/DRESSINGS) IMPLANT
SUT CHROMIC GUT AB #0 18 (SUTURE) IMPLANT
SUT MNCRL 0 VIOLET CTX 36 (SUTURE) ×3 IMPLANT
SUT MON AB 4-0 PS1 27 (SUTURE) IMPLANT
SUT MONOCRYL 0 CTX 36 (SUTURE) ×3
SUT PLAIN 2 0 (SUTURE)
SUT PLAIN ABS 2-0 CT1 27XMFL (SUTURE) IMPLANT
SUT VIC AB 0 CT1 27 (SUTURE) ×2
SUT VIC AB 0 CT1 27XBRD ANBCTR (SUTURE) ×2 IMPLANT
SUT VIC AB 2-0 CT1 27 (SUTURE) ×1
SUT VIC AB 2-0 CT1 TAPERPNT 27 (SUTURE) ×1 IMPLANT
SYR CONTROL 10ML LL (SYRINGE) ×2 IMPLANT
TAPE CLOTH SURG 4X10 WHT LF (GAUZE/BANDAGES/DRESSINGS) ×2 IMPLANT
TOWEL OR 17X24 6PK STRL BLUE (TOWEL DISPOSABLE) ×4 IMPLANT
TRAY FOLEY CATH 14FR (SET/KITS/TRAYS/PACK) ×2 IMPLANT
WATER STERILE IRR 1000ML POUR (IV SOLUTION) IMPLANT

## 2012-01-12 NOTE — Anesthesia Preprocedure Evaluation (Signed)
Anesthesia Evaluation  Patient identified by MRN, date of birth, ID band Patient awake    Reviewed: Allergy & Precautions, H&P , NPO status , Patient's Chart, lab work & pertinent test results  Airway Mallampati: I TM Distance: >3 FB Neck ROM: full    Dental No notable dental hx.    Pulmonary asthma ,    Pulmonary exam normal       Cardiovascular negative cardio ROS      Neuro/Psych negative neurological ROS  negative psych ROS   GI/Hepatic negative GI ROS, Neg liver ROS,   Endo/Other  negative endocrine ROS  Renal/GU negative Renal ROS  negative genitourinary   Musculoskeletal negative musculoskeletal ROS (+)   Abdominal Normal abdominal exam  (+)   Peds negative pediatric ROS (+)  Hematology negative hematology ROS (+)   Anesthesia Other Findings   Reproductive/Obstetrics (+) Pregnancy                           Anesthesia Physical Anesthesia Plan  ASA: II  Anesthesia Plan: Spinal   Post-op Pain Management:    Induction:   Airway Management Planned:   Additional Equipment:   Intra-op Plan:   Post-operative Plan:   Informed Consent: I have reviewed the patients History and Physical, chart, labs and discussed the procedure including the risks, benefits and alternatives for the proposed anesthesia with the patient or authorized representative who has indicated his/her understanding and acceptance.     Plan Discussed with: CRNA and Surgeon  Anesthesia Plan Comments:         Anesthesia Quick Evaluation

## 2012-01-12 NOTE — Transfer of Care (Signed)
Immediate Anesthesia Transfer of Care Note  Patient: [facility]  Procedure(s) Performed: Procedure(s) (LRB) with comments: CESAREAN SECTION (N/A) - Primary C/S.  EDD: 02/02/12  Patient Location: PACU  Anesthesia Type:Spinal  Level of Consciousness: awake, alert  and oriented  Airway & Oxygen Therapy: Patient Spontanous Breathing  Post-op Assessment: Report given to PACU RN and Post -op Vital signs reviewed and stable  Post vital signs: stable  Complications: No apparent anesthesia complications

## 2012-01-12 NOTE — Brief Op Note (Signed)
01/12/2012  8:48 AM  PATIENT:  [facility]  36 y.o. female  PRE-OPERATIVE DIAGNOSIS:  Placenta previa, IUP @ 37 WEEK  POST-OPERATIVE DIAGNOSIS:  Placenta previa. IUP @ 37 WEEKS  PROCEDURE:  Procedure(s) (LRB) with comments: CESAREAN SECTION (N/A) - Primary C/S.  EDD: 02/02/12 Sharl Ma hysterotomy  SURGEON:  Surgeon(s) and Role:    * Serita Kyle, MD - Primary  PHYSICIAN ASSISTANT:   ASSISTANTS: Paulino Door, CNM                          Arlan Organ, CNM   ANESTHESIA:   spinal FINDINGS: ANT PREVIA, LIVE FEMALE, NL TUBES AND OVARIES EBL:  Total I/O In: 2500 [I.V.:2500] Out: 1100 [Urine:100; Blood:1000]  BLOOD ADMINISTERED:none  DRAINS: none   LOCAL MEDICATIONS USED:  MARCAINE     SPECIMEN:  Source of Specimen:  placenta   DISPOSITION OF SPECIMEN:  PATHOLOGY  COUNTS:  YES  TOURNIQUET:  * No tourniquets in log *  DICTATION: .Other Dictation: Dictation Number   PLAN OF CARE: Admit to inpatient   PATIENT DISPOSITION:  PACU - hemodynamically stable.   Delay start of Pharmacological VTE agent (>24hrs) due to surgical blood loss or risk of bleeding: no

## 2012-01-12 NOTE — Anesthesia Postprocedure Evaluation (Signed)
  Anesthesia Post-op Note  Patient: Natalie Ross  Procedure(s) Performed: Procedure(s) (LRB) with comments: CESAREAN SECTION (N/A) - Primary C/S.  EDD: 02/02/12  Patient Location: Mother/Baby  Anesthesia Type:Epidural  Level of Consciousness: awake, alert  and oriented  Airway and Oxygen Therapy: Patient Spontanous Breathing  Post-op Pain: mild  Post-op Assessment: Post-op Vital signs reviewed, Patient's Cardiovascular Status Stable, Pain level controlled, No headache, No backache, No residual numbness and No residual motor weakness  Post-op Vital Signs: Reviewed and stable  Complications: No apparent anesthesia complications

## 2012-01-12 NOTE — Anesthesia Postprocedure Evaluation (Signed)
Anesthesia Post Note  Patient: [employer]  Procedure(s) Performed: Procedure(s) (LRB): CESAREAN SECTION (N/A)  Anesthesia type: Spinal  Patient location: PACU  Post pain: Pain level controlled  Post assessment: Post-op Vital signs reviewed  Last Vitals:  Filed Vitals:   01/12/12 0915  BP:   Pulse: 68  Temp:   Resp: 18    Post vital signs: Reviewed  Level of consciousness: awake  Complications: No apparent anesthesia complications

## 2012-01-12 NOTE — Addendum Note (Signed)
Addendum  created 01/12/12 2021 by Graciela Husbands, CRNA   Modules edited:Notes Section

## 2012-01-12 NOTE — Op Note (Signed)
Natalie Ross, KUGEL NO.:  [id number]  MEDICAL RECORD NO.:  [id number]  LOCATION:  9147                          FACILITY:  [facility]  PHYSICIAN:  Maxie Better, M.D.DATE OF BIRTH:  [birth year]  DATE OF PROCEDURE:  01/12/2012 DATE OF DISCHARGE:                              OPERATIVE REPORT   PREOPERATIVE DIAGNOSIS:  Placenta previa, intrauterine gestation at 37 weeks.  POSTOPERATIVE DIAGNOSIS:  Placenta previa, intrauterine gestation at 37 weeks.  PROCEDURE:  Primary cesarean section, Kerr hysterotomy.  ANESTHESIA:  Spinal.  SURGEON:  Maxie Better, M.D.  ASSISTANT:  Katherine Roan, CNM and Arlan Organ, CNM.  PROCEDURE:  Under adequate spinal anesthesia, the patient was placed in the supine position with a left lateral tilt.  She was sterilely prepped and draped in usual fashion.  An indwelling Foley catheter was sterilely placed.  A 0.25% Marcaine was injected along the planned Pfannenstiel skin incision site.  Pfannenstiel skin incision was then made, carried down to the rectus fascia.  The rectus fascia was opened transversely. The rectus fascia then bluntly and sharply dissected off the rectus muscle in superior and inferior fashion.  The rectus muscle was split in midline, and the parietal peritoneum was entered sharply and extended. On entering the abdominal cavity, the uterus was noted to have large lower uterine segment vessels.  The vesicouterine peritoneum was opened transversely.  The bladder was gently dissected off the lower uterine segment and displaced inferiorly.  A curvilinear low-transverse uterine incision was then made and extended with bandage scissors.  The placenta was encountered, which were noted to be anterior.  Subsequently, the placenta was pushed upward in order to find an opening in the amniotic sac, which was then opened, clear fluid noted and extended, subsequent delivery of a live female from the occiput anterior  position was accomplished.  Cord around the neck x1, which was reducible.  Cord was clamped, the baby was transferred to the awaiting pediatrician who assigned Apgars of 8 and 9 at 1 and 5 minutes.  The placenta was manually removed.  Uterine cavity was cleaned of debris.  Uterine incision had no extension.  Uterine incision was closed in two layers, the first layer with 0 Monocryl running locked stitch, second layer was imbricating using 0 Monocryl suture.  Good hemostasis noted along the incision line.  The abdomen was copiously irrigated and suctioned of debris.  Normal tubes and ovaries were noted bilaterally.  The Interceed was placed overlying the uterine incision.  The parietal peritoneum was closed with 2-0 Vicryl.  The rectus fascia was closed with 0 Vicryl x2. The subcutaneous area was irrigated, small bleeder was cauterized. Interrupted 2-0 plain suture was placed and the skin was reapproximated using Ethicon staples.  SPECIMEN:  Placenta sent to Pathology.  ESTIMATED BLOOD LOSS:  1 liter.  URINE OUTPUT:  100 mL.  INTRAOPERATIVE FLUID:  2500 mL.  Sponge and instrument counts x2 was correct.  COMPLICATIONS:  None.  The patient tolerated the procedure well, was transferred to the recovery room in stable condition.     Maxie Better, M.D.     [place]/MEDQ  D:  01/12/2012  T:  01/12/2012  Job:  [id number]

## 2012-01-12 NOTE — Anesthesia Procedure Notes (Signed)
Spinal  Patient location during procedure: OR Start time: 01/12/2012 7:44 AM End time: 01/12/2012 7:48 AM Staffing Anesthesiologist: Sandrea Hughs Performed by: anesthesiologist  Preanesthetic Checklist Completed: patient identified, site marked, surgical consent, pre-op evaluation, timeout performed, IV checked, risks and benefits discussed and monitors and equipment checked Spinal Block Patient position: sitting Prep: DuraPrep Patient monitoring: heart rate, cardiac monitor, continuous pulse ox and blood pressure Approach: midline Location: L3-4 Injection technique: single-shot Needle Needle type: Sprotte  Needle gauge: 24 G Needle length: 9 cm Needle insertion depth: 8 cm Assessment Sensory level: T4

## 2012-01-13 ENCOUNTER — Encounter (HOSPITAL_COMMUNITY): Payer: Self-pay | Admitting: *Deleted

## 2012-01-13 LAB — CBC
HCT: 23.6 % — ABNORMAL LOW (ref 36.0–46.0)
MCHC: 33.5 g/dL (ref 30.0–36.0)
Platelets: 221 10*3/uL (ref 150–400)
RDW: 14.8 % (ref 11.5–15.5)
WBC: 11 10*3/uL — ABNORMAL HIGH (ref 4.0–10.5)

## 2012-01-13 MED ORDER — POLYSACCHARIDE IRON COMPLEX 150 MG PO CAPS
150.0000 mg | ORAL_CAPSULE | Freq: Every day | ORAL | Status: DC
  Administered 2012-01-14 – 2012-01-15 (×2): 150 mg via ORAL
  Filled 2012-01-13 (×3): qty 1

## 2012-01-13 NOTE — Progress Notes (Signed)
UR done. 

## 2012-01-13 NOTE — Progress Notes (Signed)
Sw referral received however reason unknown.  Please reconsult with specific reason.  Sw signing off.

## 2012-01-13 NOTE — Progress Notes (Signed)
Subjective: Postpartum Day 1: Cesarean Delivery placenta previa (Anterior)  Patient reports tolerating PO, + flatus and no problems voiding.   no complaints, up ad lib without syncope Pain well controlled with po meds BF: Breast feeding on demand and coping well. Will need lactation consultation today. Mood stable, bonding well.  Objective: Vital signs in last 24 hours: Temp:  [97.4 F (36.3 C)-98.3 F (36.8 C)] 98.3 F (36.8 C) (11/13 0645) Pulse Rate:  [66-89] 89  (11/13 0645) Resp:  [18-20] 18  (11/13 0645) BP: (98-122)/(59-77) 112/71 mmHg (11/13 0645) SpO2:  [99 %-100 %] 100 % (11/13 0645)  Physical Exam:  General: alert, cooperative and no distress Breasts:  Heart: RRR Lungs: CTAB Abdomen: BS x4 Uterine Fundus: firm Incision: healing well, no significant drainage, Dressing Lochia: appropriate DVT Evaluation: No evidence of DVT seen on physical exam.   Basename 01/13/12 0500  HGB 7.9*  HCT 23.6*   Patient will need to have Po Iron Supplement - Niforex Repeat CBC in am. TOC for MRSA in am  Assessment/Plan: Status post Cesarean section. Doing well postoperatively.  Continue current care. Encourage ambulation and deep breathing exercises.  Lennart Gladish, CNM. 01/13/2012, 9:55 AM

## 2012-01-14 LAB — CBC
MCHC: 32.6 g/dL (ref 30.0–36.0)
RDW: 15.2 % (ref 11.5–15.5)

## 2012-01-14 NOTE — Progress Notes (Addendum)
Subjective: Postpartum Day 2: Cesarean Delivery PLTCS Placenta Previa Patient reports incisional pain, tolerating PO, + flatus and no problems voiding.   no complaints, up ad lib without syncope Pain well controlled with po meds BF: nipple tenderness due to infant cluster feeding. Mood stable, bonding well  Objective: Vital signs in last 24 hours: Temp:  [98.2 F (36.8 C)-98.9 F (37.2 C)] 98.2 F (36.8 C) (11/14 0550) Pulse Rate:  [72-83] 79  (11/14 0550) Resp:  [18] 18  (11/14 0550) BP: (106-110)/(66-72) 106/66 mmHg (11/14 0550)  Physical Exam:  General: alert, cooperative and no distress Breasts: Rt nipple slight tender - advised to seal areolar area with colostrum and may apply lanolin and wipe off prior to feeding. Heart: RRR Lungs: CTAB Abdomen: BS x4 Uterine Fundus: firm Incision: healing well, no significant drainage, no dehiscence, no significant erythema, Abdominal binder in place. R/o Staples in am  Lochia: appropriate DVT Evaluation: No evidence of DVT seen on physical exam. Negative Homan's sign. No cords or calf tenderness. No significant calf/ankle edema. Calf/Ankle edema is present at +1 bilateral feet.   Basename 01/14/12 0500 01/13/12 0500  HGB 7.4* 7.9*  HCT 22.7* 23.6*   Continues on po Iron supplement. Chronic anemia.  Assessment/Plan: Status post Cesarean section. Doing well postoperatively.  Continue current care. D/c home in am.  Elzina Devera, CNM. 01/14/2012, 11:17 AM

## 2012-01-15 ENCOUNTER — Encounter (HOSPITAL_COMMUNITY): Payer: Self-pay | Admitting: Obstetrics and Gynecology

## 2012-01-15 DIAGNOSIS — D649 Anemia, unspecified: Secondary | ICD-10-CM

## 2012-01-15 HISTORY — DX: Anemia, unspecified: D64.9

## 2012-01-15 MED ORDER — IBUPROFEN 600 MG PO TABS: 600.0000 mg | ORAL_TABLET | Freq: Four times a day (QID) | ORAL | Status: DC

## 2012-01-15 MED ORDER — POLYSACCHARIDE IRON COMPLEX 150 MG PO CAPS: 150.0000 mg | ORAL_CAPSULE | Freq: Every day | ORAL | Status: DC

## 2012-01-15 MED ORDER — OXYCODONE-ACETAMINOPHEN 5-325 MG PO TABS: 1.0000 | ORAL_TABLET | ORAL | Status: DC | PRN

## 2012-01-15 NOTE — Progress Notes (Signed)
Subjective: Postpartum Day 3: Cesarean Delivery for known Placenta Previa  Patient reports incisional pain, tolerating PO, + BM and no problems voiding.   no complaints, up ad lib without syncope Pain well controlled with po meds BF: Baby cluster feeding - nipples tender Mood stable, bonding well Contraception:Interested in Mirena IUD  Objective: Vital signs in last 24 hours: Temp:  [97.8 F (36.6 C)-98.6 F (37 C)] 98.1 F (36.7 C) (11/15 0600) Pulse Rate:  [76-94] 76  (11/15 0600) Resp:  [18] 18  (11/15 0600) BP: (107-114)/(69-72) 107/69 mmHg (11/15 0600)  Physical Exam:  General: alert, cooperative and no distress Breasts: Nipples tender Heart: RRR Lungs: CTAB Abdomen: BS x 4 and BM Uterine Fundus: firm Incision: healing well, no significant drainage, no dehiscence, no significant erythema, Staples removed and steri-strips in place. Advised to leave in place x 7 days and then can remove.  Lochia: appropriate DVT Evaluation: No evidence of DVT seen on physical exam. Negative Homan's sign. No cords or calf tenderness. No significant calf/ankle edema.   Basename 01/14/12 0500 01/13/12 0500  HGB 7.4* 7.9*  HCT 22.7* 23.6*   Taking po iron supplement. Chronic anemia.  Assessment/Plan: Status post Cesarean section. Doing well postoperatively.  Discharge home with standard precautions and return to clinic in 4-6 weeks.  Alok Minshall , CNM. 01/15/2012, 10:59 AM

## 2012-01-15 NOTE — Discharge Summary (Signed)
Obstetric Discharge Summary Reason for Admission: cesarean section Placenta previa Prenatal Procedures: ultrasound Intrapartum Procedures: cesarean: low cervical, transverse Postpartum Procedures: none Complications-Operative and Postpartum: none and morbidity. Continued anemia after delivery. Hemoglobin  Date Value Range Status  01/14/2012 7.4* 12.0 - 15.0 g/dL Final     HCT  Date Value Range Status  01/14/2012 22.7* 36.0 - 46.0 % Final    Physical Exam:  General: alert, cooperative, no distress and pale Lochia: appropriate Uterine Fundus: firm Incision: healing well, no significant drainage, no dehiscence, no significant erythema, staples removed on day of discharge to home. Steri-strips in place. DVT Evaluation: No evidence of DVT seen on physical exam. Negative Homan's sign. No cords or calf tenderness. No significant calf/ankle edema.  Discharge Diagnoses: Term Pregnancy-delivered and Placenta previa  Discharge Information: Date: 01/15/2012 Activity: pelvic rest also advised to avoid abdominal core exercises for 6 - 10 weeks. Diet: routine - advised iron rich diet Medications: PNV, Ibuprofen, Iron and Percocet Condition: stable Instructions: refer to practice specific booklet Discharge to: home Follow-up Information    Follow up with [facility] In 2 weeks. (As needed)    Contact information:   [address] [place] [address] [phone]         Newborn Data: Live born female  Birth Weight: 5 lb 8.4 oz (2505 g) APGAR: 8, 9  Home with mother.  Quenten Nawaz, CNM. 01/15/2012, 11:12 AM

## 2012-01-16 LAB — TYPE AND SCREEN
Antibody Screen: NEGATIVE
Unit division: 0
Unit division: 0

## 2012-01-17 ENCOUNTER — Inpatient Hospital Stay (HOSPITAL_COMMUNITY)
Admission: AD | Admit: 2012-01-17 | Discharge: 2012-01-17 | Disposition: A | Discharge status: Home / Self Care | Payer: Medicaid Other | Source: Ambulatory Visit | Attending: Obstetrics and Gynecology | Admitting: Obstetrics and Gynecology

## 2012-01-17 ENCOUNTER — Encounter (HOSPITAL_COMMUNITY): Payer: Self-pay | Admitting: *Deleted

## 2012-01-17 DIAGNOSIS — R300 Dysuria: Secondary | ICD-10-CM

## 2012-01-17 DIAGNOSIS — O9081 Anemia of the puerperium: Secondary | ICD-10-CM | POA: Insufficient documentation

## 2012-01-17 DIAGNOSIS — R609 Edema, unspecified: Secondary | ICD-10-CM

## 2012-01-17 DIAGNOSIS — D649 Anemia, unspecified: Secondary | ICD-10-CM | POA: Insufficient documentation

## 2012-01-17 DIAGNOSIS — IMO0002 Reserved for concepts with insufficient information to code with codable children: Secondary | ICD-10-CM | POA: Insufficient documentation

## 2012-01-17 LAB — URINALYSIS, ROUTINE W REFLEX MICROSCOPIC
Bilirubin Urine: NEGATIVE
Glucose, UA: NEGATIVE mg/dL
Ketones, ur: NEGATIVE mg/dL
Nitrite: NEGATIVE
Protein, ur: NEGATIVE mg/dL
Specific Gravity, Urine: 1.015 (ref 1.005–1.030)
Urobilinogen, UA: 1 mg/dL (ref 0.0–1.0)
pH: 8 (ref 5.0–8.0)

## 2012-01-17 LAB — URINE MICROSCOPIC-ADD ON

## 2012-01-17 MED ORDER — NITROFURANTOIN MONOHYD MACRO 100 MG PO CAPS: 100.0000 mg | ORAL_CAPSULE | Freq: Two times a day (BID) | ORAL | Status: DC

## 2012-01-17 MED ORDER — NITROFURANTOIN MONOHYD MACRO 100 MG PO CAPS
100.0000 mg | ORAL_CAPSULE | Freq: Once | ORAL | Status: AC
  Administered 2012-01-17: 100 mg via ORAL
  Filled 2012-01-17: qty 1

## 2012-01-17 MED ORDER — PHENAZOPYRIDINE HCL 200 MG PO TABS: 200.0000 mg | ORAL_TABLET | ORAL | Status: DC

## 2012-01-17 MED ORDER — HYDROCHLOROTHIAZIDE 12.5 MG PO CAPS: 12.5000 mg | ORAL_CAPSULE | Freq: Every day | ORAL | Status: DC

## 2012-01-17 MED ORDER — PHENAZOPYRIDINE HCL 100 MG PO TABS
200.0000 mg | ORAL_TABLET | ORAL | Status: AC
  Administered 2012-01-17: 200 mg via ORAL
  Filled 2012-01-17: qty 2

## 2012-01-17 NOTE — Progress Notes (Signed)
Written and verbal d/c instructions given and understanding voiced. Marlinda Mike CNM in earlier and discussed d/c plan and pt agrees

## 2012-01-17 NOTE — MAU Provider Note (Signed)
DUPLICATE NOTE  - COULD NOT DELETE  OR ALTER TEMPLATE OPENED SEPARATE NOTE to document encounter     History      Chief Complaint  Patient presents with  . Leg Swelling   HPI    Past Medical History  Diagnosis Date  . Placenta previa   . Vitamin D deficiency   . Placenta previa without hemorrhage 11/05/2011  . Asthma     seasonal allergy induced  . Osteopenia   . S/P cesarean section (11/12) 01/12/2012  . Postpartum care following cesarean delivery 01/12/2012  . Chronic anemia 01/15/2012    Past Surgical History  Procedure Date  . Dilation and curettage of uterus   . Cesarean section 01/12/2012    Procedure: CESAREAN SECTION;  Surgeon: Serita Kyle, MD;  Location: [facility];  Service: Obstetrics;  Laterality: N/A;  Primary C/S.  EDD: 02/02/12    Family History  Problem Relation Age of Onset  . Diabetes Father   . Vision loss Father   . Stroke Maternal Aunt   . Cancer Paternal Aunt   . Cancer Paternal Uncle   . Hypertension Maternal Grandmother   . Diabetes Maternal Grandmother   . Heart disease Maternal Grandmother   . Arthritis Maternal Grandmother   . Hypertension Paternal Grandmother   . Diabetes Paternal Grandmother   . Arthritis Paternal Grandmother   . Asthma Paternal Grandfather     History  Substance Use Topics  . Smoking status: Never Smoker   . Smokeless tobacco: Not on file  . Alcohol Use: No    Allergies:  Allergies  Allergen Reactions  . Morphine And Related Shortness Of Breath    Pt is able to take percocet without problems.  . Latex     Prescriptions prior to admission  Medication Sig Dispense Refill  . ibuprofen (ADVIL,MOTRIN) 600 MG tablet Take 1 tablet (600 mg total) by mouth every 6 (six) hours.  30 tablet  1  . iron polysaccharides (NIFEREX) 150 MG capsule Take 1 capsule (150 mg total) by mouth daily.  30 capsule  5  . Prenatal Vit-Min-FA-Fish Oil ([employer] PRENATAL GUMMY) 0.4-113.5 MG CHEW Chew 2 tablets by mouth daily.        Marland Kitchen albuterol (PROVENTIL HFA;VENTOLIN HFA) 108 (90 BASE) MCG/ACT inhaler Inhale 2 puffs into the lungs every 6 (six) hours as needed. For asthma      . oxyCODONE-acetaminophen (PERCOCET/ROXICET) 5-325 MG per tablet Take 1-2 tablets by mouth every 4 (four) hours as needed (moderate - severe pain).  20 tablet  0    ROS Physical Exam   Blood pressure 120/68, pulse 84, temperature 97.4 F (36.3 C), temperature source Oral, resp. rate 20, height 5\' 2"  (1.575 m), last menstrual period 04/28/2011, currently breastfeeding.  Physical Exam  MAU Course  Procedures   Assessment and Plan

## 2012-01-17 NOTE — Progress Notes (Signed)
Pt left by w/c rather than ambulatory

## 2012-01-17 NOTE — MAU Note (Signed)
Wiliam Ke CNM notified earlier by Laurell Josephs RN about pt's admission and status. CNM will see pt

## 2012-01-17 NOTE — MAU Provider Note (Signed)
History     CSN: [id number]  Arrival date and time: 01/17/12 0006 Call to provider [phone] Provider here to see patient [phone]     Chief Complaint  Patient presents with  . Leg Swelling   HPI  Increasing dependent edema today - feet really swollen and painful No PIH symptoms Ambulatory - no dizziness or SOB Reports intermittent burning with urination    Past Medical History  Diagnosis Date  . Placenta previa   . Vitamin D deficiency   . Placenta previa without hemorrhage 11/05/2011  . Asthma     seasonal allergy induced  . Osteopenia   . S/P cesarean section (11/12) 01/12/2012  . Postpartum care following cesarean delivery 01/12/2012  . Chronic anemia 01/15/2012    Past Surgical History  Procedure Date  . Dilation and curettage of uterus   . Cesarean section 01/12/2012    Procedure: CESAREAN SECTION;  Surgeon: Serita Kyle, MD;  Location: [facility];  Service: Obstetrics;  Laterality: N/A;  Primary C/S.  EDD: 02/02/12    Family History  Problem Relation Age of Onset  . Diabetes Father   . Vision loss Father   . Stroke Maternal Aunt   . Cancer Paternal Aunt   . Cancer Paternal Uncle   . Hypertension Maternal Grandmother   . Diabetes Maternal Grandmother   . Heart disease Maternal Grandmother   . Arthritis Maternal Grandmother   . Hypertension Paternal Grandmother   . Diabetes Paternal Grandmother   . Arthritis Paternal Grandmother   . Asthma Paternal Grandfather     History  Substance Use Topics  . Smoking status: Never Smoker   . Smokeless tobacco: Not on file  . Alcohol Use: No    Allergies:  Allergies  Allergen Reactions  . Morphine And Related Shortness Of Breath    Pt is able to take percocet without problems.  . Latex     Prescriptions prior to admission  Medication Sig Dispense Refill  . ibuprofen (ADVIL,MOTRIN) 600 MG tablet Take 1 tablet (600 mg total) by mouth every 6 (six) hours.  30 tablet  1  . iron polysaccharides (NIFEREX) 150  MG capsule Take 1 capsule (150 mg total) by mouth daily.  30 capsule  5  . Prenatal Vit-Min-FA-Fish Oil ([employer] PRENATAL GUMMY) 0.4-113.5 MG CHEW Chew 2 tablets by mouth daily.      Marland Kitchen albuterol (PROVENTIL HFA;VENTOLIN HFA) 108 (90 BASE) MCG/ACT inhaler Inhale 2 puffs into the lungs every 6 (six) hours as needed. For asthma      . oxyCODONE-acetaminophen (PERCOCET/ROXICET) 5-325 MG per tablet Take 1-2 tablets by mouth every 4 (four) hours as needed (moderate - severe pain).  20 tablet  0    ROS Physical Exam   Blood pressure 120/68, pulse 84, temperature 97.4 F (36.3 C), temperature source Oral, resp. rate 20, height 5\' 2"  (1.575 m), last menstrual period 04/28/2011, currently breastfeeding.  Physical Exam  Alert and oriented Abdomen soft and non-tender Incision healing well - no erythema or drainage Dependent edema in lower extremities 3+ up to level of knee bilaterally  MAU Course  Procedures  None  Assessment and Plan   POD 4 cesarean section Chronic iron deficiency anemia compounded by ABL postoperatively Dependent edema Dysuria  1) urinalysis and culture - start 3 day course of Macrobid pending culture result 2) PO hydration with water - min of 6 glasses (bottles) of water a day 3) HCTZ 12.5 daily x 5 days 4) elevate feet - rest at home  5) discharge to home 6) follow-up in office in 1 week if symptoms persist   Saban Heinlen 01/17/2012, 1:08 AM

## 2012-01-17 NOTE — MAU Note (Signed)
Incision clean and dry with steri strips in place. No redness, swelling, drainage

## 2012-01-17 NOTE — MAU Note (Signed)
Had primary c/s 11/12 for placenta previa. Went home yesterday. Had some swelling in lower legs and feet when went home but has gotten a lot worse. Hard to walk.

## 2012-01-17 NOTE — MAU Note (Signed)
Natalie Ross CNM in with pt

## 2012-01-19 LAB — URINE CULTURE: Colony Count: >=100000

## 2014-01-01 ENCOUNTER — Encounter (HOSPITAL_COMMUNITY): Payer: Self-pay | Admitting: *Deleted

## 2014-08-02 ENCOUNTER — Emergency Department (HOSPITAL_BASED_OUTPATIENT_CLINIC_OR_DEPARTMENT_OTHER)
Admission: EM | Admit: 2014-08-02 | Discharge: 2014-08-02 | Disposition: A | Discharge status: Home / Self Care | Payer: BLUE CROSS/BLUE SHIELD | Attending: Emergency Medicine | Admitting: Emergency Medicine

## 2014-08-02 ENCOUNTER — Emergency Department (HOSPITAL_BASED_OUTPATIENT_CLINIC_OR_DEPARTMENT_OTHER): Payer: BLUE CROSS/BLUE SHIELD

## 2014-08-02 ENCOUNTER — Encounter (HOSPITAL_BASED_OUTPATIENT_CLINIC_OR_DEPARTMENT_OTHER): Payer: Self-pay | Admitting: *Deleted

## 2014-08-02 DIAGNOSIS — J45909 Unspecified asthma, uncomplicated: Secondary | ICD-10-CM | POA: Diagnosis not present

## 2014-08-02 DIAGNOSIS — Z91040 Latex allergy status: Secondary | ICD-10-CM | POA: Insufficient documentation

## 2014-08-02 DIAGNOSIS — Z8639 Personal history of other endocrine, nutritional and metabolic disease: Secondary | ICD-10-CM | POA: Insufficient documentation

## 2014-08-02 DIAGNOSIS — K219 Gastro-esophageal reflux disease without esophagitis: Secondary | ICD-10-CM | POA: Insufficient documentation

## 2014-08-02 DIAGNOSIS — Z862 Personal history of diseases of the blood and blood-forming organs and certain disorders involving the immune mechanism: Secondary | ICD-10-CM | POA: Diagnosis not present

## 2014-08-02 DIAGNOSIS — Z3202 Encounter for pregnancy test, result negative: Secondary | ICD-10-CM | POA: Insufficient documentation

## 2014-08-02 DIAGNOSIS — Z8739 Personal history of other diseases of the musculoskeletal system and connective tissue: Secondary | ICD-10-CM | POA: Insufficient documentation

## 2014-08-02 DIAGNOSIS — R1013 Epigastric pain: Secondary | ICD-10-CM | POA: Diagnosis present

## 2014-08-02 LAB — CBC WITH DIFFERENTIAL/PLATELET
BASOS ABS: 0 10*3/uL (ref 0.0–0.1)
Basophils Relative: 0 % (ref 0–1)
EOS PCT: 1 % (ref 0–5)
Eosinophils Absolute: 0.1 10*3/uL (ref 0.0–0.7)
HEMATOCRIT: 38.5 % (ref 36.0–46.0)
HEMOGLOBIN: 12.5 g/dL (ref 12.0–15.0)
Lymphocytes Relative: 29 % (ref 12–46)
Lymphs Abs: 2.6 10*3/uL (ref 0.7–4.0)
MCH: 27.6 pg (ref 26.0–34.0)
MCHC: 32.5 g/dL (ref 30.0–36.0)
MCV: 85 fL (ref 78.0–100.0)
MONOS PCT: 6 % (ref 3–12)
Monocytes Absolute: 0.5 10*3/uL (ref 0.1–1.0)
NEUTROS PCT: 64 % (ref 43–77)
Neutro Abs: 5.5 10*3/uL (ref 1.7–7.7)
Platelets: 345 10*3/uL (ref 150–400)
RBC: 4.53 MIL/uL (ref 3.87–5.11)
RDW: 14.1 % (ref 11.5–15.5)
WBC: 8.7 10*3/uL (ref 4.0–10.5)

## 2014-08-02 LAB — BASIC METABOLIC PANEL
Anion gap: 7 (ref 5–15)
BUN: 15 mg/dL (ref 6–20)
CHLORIDE: 104 mmol/L (ref 101–111)
CO2: 28 mmol/L (ref 22–32)
Calcium: 9.2 mg/dL (ref 8.9–10.3)
Creatinine, Ser: 1.01 mg/dL — ABNORMAL HIGH (ref 0.44–1.00)
GFR calc Af Amer: >60 mL/min (ref >60)
GLUCOSE: 90 mg/dL (ref 65–99)
Potassium: 3.7 mmol/L (ref 3.5–5.1)
SODIUM: 139 mmol/L (ref 135–145)

## 2014-08-02 LAB — TROPONIN I

## 2014-08-02 LAB — PREGNANCY, URINE: PREG TEST UR: NEGATIVE

## 2014-08-02 LAB — URINALYSIS, ROUTINE W REFLEX MICROSCOPIC
BILIRUBIN URINE: NEGATIVE
GLUCOSE, UA: NEGATIVE mg/dL
Hgb urine dipstick: NEGATIVE
KETONES UR: NEGATIVE mg/dL
LEUKOCYTES UA: NEGATIVE
NITRITE: NEGATIVE
PH: 7.5 (ref 5.0–8.0)
PROTEIN: NEGATIVE mg/dL
SPECIFIC GRAVITY, URINE: 1.023 (ref 1.005–1.030)
Urobilinogen, UA: 1 mg/dL (ref 0.0–1.0)

## 2014-08-02 MED ORDER — PANTOPRAZOLE SODIUM 20 MG PO TBEC: 20.0000 mg | DELAYED_RELEASE_TABLET | Freq: Every day | ORAL | Status: DC

## 2014-08-02 MED ORDER — GI COCKTAIL ~~LOC~~
30.0000 mL | Freq: Once | ORAL | Status: AC
  Administered 2014-08-02: 30 mL via ORAL
  Filled 2014-08-02: qty 30

## 2014-08-02 NOTE — ED Notes (Signed)
Pt amb to triage with quick steady gait in nad. Pt reports "burping a lot" and epigastric pain x yesterday.

## 2014-08-02 NOTE — ED Provider Notes (Signed)
CSN: [id number]     Arrival date & time 08/02/14  1722 History   First MD Initiated Contact with Patient 08/02/14 1803     Chief Complaint  Patient presents with  . Abdominal Pain     (Consider location/radiation/quality/duration/timing/severity/associated sxs/prior Treatment) Patient is a 39 y.o. female presenting with abdominal pain. The history is provided by the patient. No language interpreter was used.  Abdominal Pain Pain location:  Epigastric Pain quality: aching and burning   Pain radiates to:  Chest Pain severity:  Moderate Onset quality:  Sudden Timing:  Constant Chronicity:  New Relieved by:  Nothing Worsened by:  Nothing tried Ineffective treatments:  None tried Associated symptoms: belching, cough and sore throat   Associated symptoms: no fever, no melena and no vomiting     Past Medical History  Diagnosis Date  . Placenta previa   . Vitamin D deficiency   . Placenta previa without hemorrhage 11/05/2011  . Asthma     seasonal allergy induced  . Osteopenia   . S/P cesarean section (11/12) 01/12/2012  . Postpartum care following cesarean delivery 01/12/2012  . Chronic anemia 01/15/2012   Past Surgical History  Procedure Laterality Date  . Dilation and curettage of uterus    . Cesarean section  01/12/2012    Procedure: CESAREAN SECTION;  Surgeon: Serita Kyle, MD;  Location: [facility];  Service: Obstetrics;  Laterality: N/A;  Primary C/S.  EDD: 02/02/12   Family History  Problem Relation Age of Onset  . Diabetes Father   . Vision loss Father   . Stroke Maternal Aunt   . Cancer Paternal Aunt   . Cancer Paternal Uncle   . Hypertension Maternal Grandmother   . Diabetes Maternal Grandmother   . Heart disease Maternal Grandmother   . Arthritis Maternal Grandmother   . Hypertension Paternal Grandmother   . Diabetes Paternal Grandmother   . Arthritis Paternal Grandmother   . Asthma Paternal Grandfather    History  Substance Use Topics  . Smoking  status: Never Smoker   . Smokeless tobacco: Not on file  . Alcohol Use: No   OB History    Gravida Para Term Preterm AB TAB SAB Ectopic Multiple Living   0 1   2     Review of Systems  Constitutional: Negative for fever.  HENT: Positive for sore throat.   Respiratory: Positive for cough.   Gastrointestinal: Positive for abdominal pain. Negative for vomiting and melena.  All other systems reviewed and are negative.     Allergies  Morphine and related and Latex  Home Medications   Prior to Admission medications   Not on File   BP 119/82 mmHg  Pulse 78  Temp(Src) 98.5 F (36.9 C) (Oral)  Resp 18  Ht  (1.6 m)  Wt 159 lb (72.122 kg)  BMI 28.17 kg/m2  SpO2 100%  LMP 07/26/2014 Physical Exam  Constitutional: She is oriented to person, place, and time. She appears well-developed and well-nourished.  HENT:  Head: Normocephalic and atraumatic.  Right Ear: External ear normal.  Eyes: Conjunctivae and EOM are normal. Pupils are equal, round, and reactive to light.  Cardiovascular: Normal rate and regular rhythm.   Pulmonary/Chest: Effort normal and breath sounds normal.  Abdominal: Soft. Bowel sounds are normal. There is tenderness in the epigastric area.  Musculoskeletal: Normal range of motion.  Neurological: She is alert and oriented to person, place, and time.  Skin: Skin is warm and dry.  Nursing note and vitals reviewed.   ED Course  Procedures (including critical care time) Labs Review Labs Reviewed  BASIC METABOLIC PANEL - Abnormal; Notable for the following:    Creatinine, Ser 1.01 (*)    All other components within normal limits  CBC WITH DIFFERENTIAL/PLATELET  TROPONIN I  PREGNANCY, URINE  URINALYSIS, ROUTINE W REFLEX MICROSCOPIC (NOT [facility])    Imaging Review Dg Chest 2 View  08/02/2014   CLINICAL DATA:  Abdominal pain, facial pain in shortness of breath  EXAM: CHEST  2 VIEW  COMPARISON:  09/15/2008.  FINDINGS: The heart size and  mediastinal contours are within normal limits. Both lungs are clear. The visualized skeletal structures are unremarkable.  IMPRESSION: No active cardiopulmonary disease.   Electronically Signed   By: Signa Kellaylor  Stroud M.D.   On: 08/02/2014 19:20     EKG Interpretation   Date/Time:  Thursday August 02 2014 18:28:49 EDT Ventricular Rate:  84 PR Interval:  140 QRS Duration: 76 QT Interval:  354 QTC Calculation: 418 R Axis:   52 Text Interpretation:  Normal sinus rhythm Normal ECG No previous ECGs  available Confirmed by ZACKOWSKI  MD, SCOTT [phone]([id number]) on 08/02/2014 6:37:44 PM      MDM   Final diagnoses:  Gastroesophageal reflux disease, esophagitis presence not specified    Pt is feeling better a this time. Likely reflux. Discussed use of protonix with pt and follow up with pcp for continued symptoms. Abdominal exam no consistent with acute process    Teressa LowerVrinda Adalie Mand, NP 08/02/14 [phone]  Vanetta MuldersScott Zackowski, MD 08/02/14 2321

## 2014-08-02 NOTE — Discharge Instructions (Signed)
Gastroesophageal Reflux Disease, Adult Gastroesophageal reflux disease (GERD) happens when acid from your stomach goes into your food pipe (esophagus). The acid can cause a burning feeling in your chest. Over time, the acid can make small holes (ulcers) in your food pipe.  HOME CARE  Ask your doctor for advice about:  Losing weight.  Quitting smoking.  Alcohol use.  Avoid foods and drinks that make your problems worse. You may want to avoid:  Caffeine and alcohol.  Chocolate.  Mints.  Garlic and onions.  Spicy foods.  Citrus fruits, such as oranges, lemons, or limes.  Foods that contain tomato, such as sauce, chili, salsa, and pizza.  Fried and fatty foods.  Avoid lying down for 3 hours before you go to bed or before you take a nap.  Eat small meals often, instead of large meals.  Wear loose-fitting clothing. Do not wear anything tight around your waist.  Raise (elevate) the head of your bed 6 to 8 inches with wood blocks. Using extra pillows does not help.  Only take medicines as told by your doctor.  Do not take aspirin or ibuprofen. GET HELP RIGHT AWAY IF:   You have pain in your arms, neck, jaw, teeth, or back.  Your pain gets worse or changes.  You feel sick to your stomach (nauseous), throw up (vomit), or sweat (diaphoresis).  You feel short of breath, or you pass out (faint).  Your throw up is green, yellow, black, or looks like coffee grounds or blood.  Your poop (stool) is red, bloody, or black. MAKE SURE YOU:   Understand these instructions.  Will watch your condition.  Will get help right away if you are not doing well or get worse. Document Released: 08/05/2007 Document Revised: 05/11/2011 Document Reviewed: 09/05/2010 [facility] Patient Information 2015 [place], [place]. This information is not intended to replace advice given to you by your health care provider. Make sure you discuss any questions you have with your health care  provider.  Food Choices for Gastroesophageal Reflux Disease When you have gastroesophageal reflux disease (GERD), the foods you eat and your eating habits are very important. Choosing the right foods can help ease your discomfort.  WHAT GUIDELINES DO I NEED TO FOLLOW?   Choose fruits, vegetables, whole grains, and low-fat dairy products.   Choose low-fat meat, fish, and poultry.  Limit fats such as oils, salad dressings, butter, nuts, and avocado.   Keep a food diary. This helps you identify foods that cause symptoms.   Avoid foods that cause symptoms. These may be different for everyone.   Eat small meals often instead of 3 large meals a day.   Eat your meals slowly, in a place where you are relaxed.   Limit fried foods.   Cook foods using methods other than frying.   Avoid drinking alcohol.   Avoid drinking large amounts of liquids with your meals.   Avoid bending over or lying down until 2-3 hours after eating.  WHAT FOODS ARE NOT RECOMMENDED?  These are some foods and drinks that may make your symptoms worse: Vegetables Tomatoes. Tomato juice. Tomato and spaghetti sauce. Chili peppers. Onion and garlic. Horseradish. Fruits Oranges, grapefruit, and lemon (fruit and juice). Meats High-fat meats, fish, and poultry. This includes hot dogs, ribs, ham, sausage, salami, and bacon. Dairy Whole milk and chocolate milk. Sour cream. Cream. Butter. Ice cream. Cream cheese.  Drinks Coffee and tea. Bubbly (carbonated) drinks or energy drinks. Condiments Hot sauce. Barbecue sauce.  Sweets/Desserts Chocolate  and cocoa. Donuts. Peppermint and spearmint. Fats and Oils High-fat foods. This includes [place] fries and potato chips. Other Vinegar. Strong spices. This includes black pepper, white pepper, red pepper, cayenne, curry powder, cloves, ginger, and chili powder. The items listed above may not be a complete list of foods and drinks to avoid. Contact your dietitian for  more information. Document Released: 08/18/2011 Document Revised: 02/21/2013 Document Reviewed: 12/21/2012 [facility] Patient Information 2015 [place], [place]. This information is not intended to replace advice given to you by your health care provider. Make sure you discuss any questions you have with your health care provider.

## 2014-08-10 ENCOUNTER — Emergency Department (HOSPITAL_BASED_OUTPATIENT_CLINIC_OR_DEPARTMENT_OTHER): Payer: BLUE CROSS/BLUE SHIELD

## 2014-08-10 ENCOUNTER — Encounter (HOSPITAL_BASED_OUTPATIENT_CLINIC_OR_DEPARTMENT_OTHER): Payer: Self-pay | Admitting: *Deleted

## 2014-08-10 ENCOUNTER — Emergency Department (HOSPITAL_BASED_OUTPATIENT_CLINIC_OR_DEPARTMENT_OTHER)
Admission: EM | Admit: 2014-08-10 | Discharge: 2014-08-10 | Disposition: A | Discharge status: Home / Self Care | Payer: BLUE CROSS/BLUE SHIELD | Attending: Emergency Medicine | Admitting: Emergency Medicine

## 2014-08-10 DIAGNOSIS — Y9389 Activity, other specified: Secondary | ICD-10-CM | POA: Diagnosis not present

## 2014-08-10 DIAGNOSIS — S0990XA Unspecified injury of head, initial encounter: Secondary | ICD-10-CM | POA: Insufficient documentation

## 2014-08-10 DIAGNOSIS — S0993XA Unspecified injury of face, initial encounter: Secondary | ICD-10-CM | POA: Diagnosis not present

## 2014-08-10 DIAGNOSIS — Z79899 Other long term (current) drug therapy: Secondary | ICD-10-CM | POA: Diagnosis not present

## 2014-08-10 DIAGNOSIS — Z8639 Personal history of other endocrine, nutritional and metabolic disease: Secondary | ICD-10-CM | POA: Insufficient documentation

## 2014-08-10 DIAGNOSIS — Z862 Personal history of diseases of the blood and blood-forming organs and certain disorders involving the immune mechanism: Secondary | ICD-10-CM | POA: Diagnosis not present

## 2014-08-10 DIAGNOSIS — S4991XA Unspecified injury of right shoulder and upper arm, initial encounter: Secondary | ICD-10-CM | POA: Diagnosis not present

## 2014-08-10 DIAGNOSIS — Y9289 Other specified places as the place of occurrence of the external cause: Secondary | ICD-10-CM | POA: Insufficient documentation

## 2014-08-10 DIAGNOSIS — Y998 Other external cause status: Secondary | ICD-10-CM | POA: Insufficient documentation

## 2014-08-10 DIAGNOSIS — Z8739 Personal history of other diseases of the musculoskeletal system and connective tissue: Secondary | ICD-10-CM | POA: Insufficient documentation

## 2014-08-10 DIAGNOSIS — J45909 Unspecified asthma, uncomplicated: Secondary | ICD-10-CM | POA: Insufficient documentation

## 2014-08-10 MED ORDER — IBUPROFEN 400 MG PO TABS
600.0000 mg | ORAL_TABLET | Freq: Once | ORAL | Status: AC
  Administered 2014-08-10: 600 mg via ORAL
  Filled 2014-08-10 (×2): qty 1
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
## 2015-02-12 ENCOUNTER — Other Ambulatory Visit (HOSPITAL_COMMUNITY)
Admission: RE | Admit: 2015-02-12 | Discharge: 2015-02-12 | Disposition: A | Discharge status: Home / Self Care | Payer: BLUE CROSS/BLUE SHIELD | Source: Ambulatory Visit | Attending: Nurse Practitioner | Admitting: Nurse Practitioner

## 2015-02-12 ENCOUNTER — Other Ambulatory Visit: Payer: Self-pay | Admitting: Nurse Practitioner

## 2015-02-12 DIAGNOSIS — Z01411 Encounter for gynecological examination (general) (routine) with abnormal findings: Secondary | ICD-10-CM | POA: Diagnosis present

## 2015-02-12 DIAGNOSIS — Z1151 Encounter for screening for human papillomavirus (HPV): Secondary | ICD-10-CM | POA: Insufficient documentation

## 2015-02-14 LAB — CYTOLOGY - PAP

## 2015-11-26 ENCOUNTER — Encounter: Payer: Self-pay | Admitting: Physical Therapy

## 2015-11-26 ENCOUNTER — Ambulatory Visit: Payer: BLUE CROSS/BLUE SHIELD | Attending: Sports Medicine | Admitting: Physical Therapy

## 2015-11-26 DIAGNOSIS — M62830 Muscle spasm of back: Secondary | ICD-10-CM | POA: Diagnosis present

## 2015-11-26 DIAGNOSIS — M542 Cervicalgia: Secondary | ICD-10-CM

## 2015-11-26 DIAGNOSIS — M5441 Lumbago with sciatica, right side: Secondary | ICD-10-CM
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
## 2015-12-03 ENCOUNTER — Ambulatory Visit: Payer: BLUE CROSS/BLUE SHIELD | Admitting: Physical Therapy
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
## 2016-09-09 ENCOUNTER — Encounter (HOSPITAL_COMMUNITY): Payer: Self-pay | Admitting: Emergency Medicine

## 2016-09-09 ENCOUNTER — Emergency Department (HOSPITAL_COMMUNITY)
Admission: EM | Admit: 2016-09-09 | Discharge: 2016-09-09 | Disposition: A | Discharge status: Home / Self Care | Payer: BLUE CROSS/BLUE SHIELD | Attending: Emergency Medicine | Admitting: Emergency Medicine

## 2016-09-09 ENCOUNTER — Emergency Department (HOSPITAL_COMMUNITY): Payer: BLUE CROSS/BLUE SHIELD

## 2016-09-09 DIAGNOSIS — Y999 Unspecified external cause status: Secondary | ICD-10-CM | POA: Diagnosis not present

## 2016-09-09 DIAGNOSIS — J45909 Unspecified asthma, uncomplicated: Secondary | ICD-10-CM | POA: Diagnosis not present

## 2016-09-09 DIAGNOSIS — Y929 Unspecified place or not applicable: Secondary | ICD-10-CM | POA: Diagnosis not present

## 2016-09-09 DIAGNOSIS — Z23 Encounter for immunization: Secondary | ICD-10-CM | POA: Insufficient documentation

## 2016-09-09 DIAGNOSIS — S56922A Laceration of unspecified muscles, fascia and tendons at forearm level, left arm, initial encounter: Secondary | ICD-10-CM | POA: Diagnosis not present

## 2016-09-09 DIAGNOSIS — Z91040 Latex allergy status: Secondary | ICD-10-CM | POA: Insufficient documentation

## 2016-09-09 DIAGNOSIS — W01110A Fall on same level from slipping, tripping and stumbling with subsequent striking against sharp glass, initial encounter: Secondary | ICD-10-CM | POA: Diagnosis not present

## 2016-09-09 DIAGNOSIS — S51812A Laceration without foreign body of left forearm, initial encounter: Secondary | ICD-10-CM | POA: Diagnosis not present

## 2016-09-09 DIAGNOSIS — Z885 Allergy status to narcotic agent status: Secondary | ICD-10-CM | POA: Diagnosis not present

## 2016-09-09 DIAGNOSIS — Y939 Activity, unspecified: Secondary | ICD-10-CM | POA: Insufficient documentation

## 2016-09-09 MED ORDER — LIDOCAINE-EPINEPHRINE (PF) 2 %-1:200000 IJ SOLN
10.0000 mL | Freq: Once | INTRAMUSCULAR | Status: AC
  Administered 2016-09-09: 10 mL
  Filled 2016-09-09: qty 20

## 2016-09-09 MED ORDER — BUPIVACAINE HCL (PF) 0.5 % IJ SOLN
10.0000 mL | Freq: Once | INTRAMUSCULAR | Status: AC
  Administered 2016-09-09: 10 mL
  Filled 2016-09-09: qty 10

## 2016-09-09 MED ORDER — TETANUS-DIPHTH-ACELL PERTUSSIS 5-2.5-18.5 LF-MCG/0.5 IM SUSP
0.5000 mL | Freq: Once | INTRAMUSCULAR | Status: AC
  Administered 2016-09-09: 0.5 mL via INTRAMUSCULAR
  Filled 2016-09-09: qty 0.5

## 2016-09-09 MED ORDER — OXYCODONE-ACETAMINOPHEN 5-325 MG PO TABS
1.0000 | ORAL_TABLET | Freq: Once | ORAL | Status: AC
  Administered 2016-09-09: 1 via ORAL
  Filled 2016-09-09: qty 1

## 2016-09-09 MED ORDER — OXYCODONE-ACETAMINOPHEN 5-325 MG PO TABS: 1.0000 | ORAL_TABLET | Freq: Four times a day (QID) | ORAL | 0 refills | Status: DC | PRN

## 2016-09-09 MED ORDER — FLUCONAZOLE 150 MG PO TABS: 150.0000 mg | ORAL_TABLET | Freq: Once | ORAL | 0 refills | Status: AC

## 2016-09-09 MED ORDER — CEPHALEXIN 500 MG PO CAPS: 500.0000 mg | ORAL_CAPSULE | Freq: Three times a day (TID) | ORAL | 0 refills | Status: DC

## 2016-09-09 MED ORDER — OXYCODONE-ACETAMINOPHEN 5-325 MG PO TABS
1.0000 | ORAL_TABLET | ORAL | Status: DC | PRN
  Administered 2016-09-09: 1 via ORAL
  Filled 2016-09-09: qty 1

## 2016-09-09 MED ORDER — CEPHALEXIN 250 MG PO CAPS
500.0000 mg | ORAL_CAPSULE | Freq: Once | ORAL | Status: AC
  Administered 2016-09-09: 500 mg via ORAL
  Filled 2016-09-09: qty 2

## 2016-09-09 NOTE — ED Triage Notes (Signed)
Patient from home with [facility] after tripping and falling through a glass curio cabinet.  Multiple large lacerations to left arm, bleeding controlled.  Vital signs stable, patient alert and oriented and in no apparent distress at this time.

## 2016-09-09 NOTE — ED Provider Notes (Signed)
Laceration repair by myself. Please see Dr. Wonda AmisYelverton's note for further. Four lacerations repaired with a total of 14 sutures placed. Patient tolerated procedure well.     LACERATION REPAIR Performed by: [name],[name] Authorized by: [name],[name] Consent: Verbal consent obtained. Risks and benefits: risks, benefits and alternatives were discussed Consent given by: patient Patient identity confirmed: provided demographic data Prepped and Draped in normal sterile fashion Wound explored  Laceration Location: left forearm   Laceration Length: 6 cm  Small flecks of glass foreign bodies removed.   Anesthesia: local infiltration  Local anesthetic: lidocaine 1% w epinephrine & Marcaine 0.25%  Anesthetic total: 5 ml  Irrigation method: syringe, pressure wash  Amount of cleaning: extensive   Skin closure: 4-0 Proline   Number of sutures: 6  Technique: simple interrupted.   Patient tolerance: Patient tolerated the procedure well with no immediate complications.  LACERATION REPAIR Performed by: [name],[name] Authorized by: [name],[name] Consent: Verbal consent obtained. Risks and benefits: risks, benefits and alternatives were discussed Consent given by: patient Patient identity confirmed: provided demographic data Prepped and Draped in normal sterile fashion Wound explored  Laceration Location: left wrist and hand   Laceration Length: 6 cm  No Foreign Bodies seen or palpated  Anesthesia: local infiltration  Local anesthetic: lidocaine 1% w epinephrine & Marcaine 0.25%  Anesthetic total: 3 ml  Irrigation method: syringe, pressure wash  Amount of cleaning: extensive   Skin closure: 4-0 Proline   Number of sutures: 8  Technique: simple interrupted.   Patient tolerance: Patient tolerated the procedure well with no immediate complications.   LACERATION REPAIR Performed by: [name],[name] Authorized by: [name],[name] Consent: Verbal consent obtained. Risks and benefits: risks, benefits and alternatives were discussed Consent given by: patient Patient identity confirmed: provided demographic data Prepped and Draped in normal sterile fashion Wound explored  Laceration Location: left lateral wrist   Laceration Length: 1 cm  No Foreign Bodies seen or palpated  Anesthesia: local infiltration  Local anesthetic: lidocaine 1% w epinephrine & Marcaine 0.25%  Anesthetic total: 1  ml  Irrigation method: syringe Amount of cleaning: standard  Skin closure: 4-0 Proline   Number of sutures: 1  Technique:  Simple interrupted.   Patient tolerance: Patient tolerated the procedure well with no immediate complications.   LACERATION REPAIR Performed by: [name],[name] Authorized by: [name],[name] Consent: Verbal consent obtained. Risks and benefits: risks, benefits and alternatives were discussed Consent given by: patient Patient identity confirmed: provided demographic data Prepped and Draped in normal sterile fashion Wound explored  Laceration Location: left lateral wrist   Laceration Length: 2 cm  No Foreign Bodies seen or palpated  Anesthesia: local infiltration  Local anesthetic: lidocaine 1% w epinephrine & Marcaine 0.25%  Anesthetic total: 0.5  ml  Irrigation method: syringe Amount of cleaning: standard  Skin closure: 4-0 Proline   Number of sutures: 2  Technique: simple interrupted.   Patient tolerance: Patient tolerated the procedure well with no immediate complications.     [name], Natalie Hunton, PA-C 09/09/16 1854    [name], [name], MD 09/10/16 1515

## 2016-09-09 NOTE — ED Notes (Signed)
Paged hand/COLEY

## 2016-09-09 NOTE — Discharge Instructions (Signed)
Call Dr. Debby Budoley's office tomorrow morning to arrange close follow-up in the next one to 2 days. If you're unable to schedule appointment return immediately to the emergency department. Return immediately to the emergency department for any evidence of increased redness, swelling, warmth or other indicators of infection.

## 2016-09-09 NOTE — Progress Notes (Signed)
Orthopedic Tech Progress Note Patient Details:  [facility] [birth year] [id number]  Ortho Devices Type of Ortho Device: Arm sling, Volar splint Ortho Device/Splint Location: lue Ortho Device/Splint Interventions: Ordered, Application   Trinna PostMartinez, Jacalynn Buzzell J 09/09/2016, 7:51 PM

## 2016-09-09 NOTE — ED Provider Notes (Signed)
[facility]-EMER[facility]ENCY DEPT Provider Note   CSN: [id number] Arrival date & time: 09/09/16  1357     History   Chief Complaint Chief Complaint  Patient presents with  . Laceration    HPI Natalie Ross is a 41 y.o. female.  HPI Patient presents by EMS for multiple lacerations to her left arm after falling and putting her hand through a glass cabinet. She denies any head or neck injuries. No loss of consciousness. Thinks she had a tetanus shot roughly 4 years ago. She has some tingling to her fingers. There definitely appreciated foreign bodies. Bleeding is currently controlled. Past Medical History:  Diagnosis Date  . Asthma    seasonal allergy induced  . Chronic anemia 01/15/2012  . Osteopenia   . Placenta previa   . Placenta previa without hemorrhage 11/05/2011  . Postpartum care following cesarean delivery 01/12/2012  . S/P cesarean section (11/12) 01/12/2012  . Vitamin D deficiency     There are no active problems to display for this patient.   Past Surgical History:  Procedure Laterality Date  . CESAREAN SECTION  01/12/2012   Procedure: CESAREAN SECTION;  Surgeon: Serita KyleSheronette A Cousins, MD;  Location: [facility];  Service: Obstetrics;  Laterality: N/A;  Primary C/S.  EDD: 02/02/12  . DILATION AND CURETTAGE OF UTERUS      OB History    Gravida Para Term Preterm AB Living   3 2 1   1 2    SAB TAB Ectopic Multiple Live Births   1 0     2       Home Medications    Prior to Admission medications   Medication Sig Start Date End Date Taking? Authorizing Provider  ergocalciferol (VITAMIN D2) 50000 units capsule Take 50,000 Units by mouth 2 (two) times a week.   Yes [provider]  vitamin C (ASCORBIC ACID) 250 MG tablet Take 250 mg by mouth daily.   Yes [provider]  cephALEXin (KEFLEX) 500 MG capsule Take 1 capsule (500 mg total) by mouth 3 (three) times daily. 09/09/16   [name], Corlette Ciano, MD  oxyCODONE-acetaminophen (PERCOCET) 5-325 MG tablet Take 1-2  tablets by mouth every 6 (six) hours as needed. 09/09/16   [name], Eldonna Neuenfeldt, MD  pantoprazole (PROTONIX) 20 MG tablet Take 1 tablet (20 mg total) by mouth daily. Patient not taking: Reported on 09/09/2016 08/02/14   Teressa LowerPickering, Vrinda, NP    Family History Family History  Problem Relation Age of Onset  . Diabetes Father   . Vision loss Father   . Stroke Maternal Aunt   . Cancer Paternal Aunt   . Cancer Paternal Uncle   . Hypertension Maternal Grandmother   . Diabetes Maternal Grandmother   . Heart disease Maternal Grandmother   . Arthritis Maternal Grandmother   . Hypertension Paternal Grandmother   . Diabetes Paternal Grandmother   . Arthritis Paternal Grandmother   . Asthma Paternal Grandfather     Social History Social History  Substance Use Topics  . Smoking status: Never Smoker  . Smokeless tobacco: Not on file  . Alcohol use No     Allergies   Morphine; Morphine and related; Pollen extract; and Latex   Review of Systems Review of Systems  Constitutional: Negative for chills and fever.  Respiratory: Negative for shortness of breath.   Cardiovascular: Negative for chest pain.  Gastrointestinal: Negative for abdominal pain and nausea.  Musculoskeletal: Negative for arthralgias, myalgias and neck pain.  Skin: Positive for wound.  Neurological: Positive for numbness.  Negative for syncope, weakness and headaches.  All other systems reviewed and are negative.    Physical Exam Updated Vital Signs BP 120/76   Pulse 88   Temp 98.1 F (36.7 C) (Oral)   Resp (!) 22   Ht 5\' 2"  (1.575 m)   Wt 69.9 kg (154 lb)   SpO2 100%   BMI 28.17 kg/m   Physical Exam  Constitutional: She is oriented to person, place, and time. She appears well-developed and well-nourished. No distress.  HENT:  Head: Normocephalic and atraumatic.  Mouth/Throat: Oropharynx is clear and moist.  Eyes: EOM are normal. Pupils are equal, round, and reactive to light.  Neck: Normal range of motion.  Neck supple.  No posterior midline cervical tenderness to palpation.  Cardiovascular: Normal rate and regular rhythm.   Pulmonary/Chest: Effort normal and breath sounds normal.  Abdominal: Soft. Bowel sounds are normal. There is no tenderness. There is no rebound and no guarding.  Musculoskeletal: She exhibits tenderness. She exhibits no edema.  Patient with limited range of motion of the extension and flexion left wrist and left fingers. Unsure whether this is due to pain or neurologic dysfunction. No obvious bony deformities of the left elbow, forearm or wrist. 2+ radial pulses  Neurological: She is alert and oriented to person, place, and time.  Patient claims to have tingling sensation to the fingertips of the left hand. Grip strength is slightly decreased this may be due to pain.  Skin: Skin is warm and dry. Capillary refill takes less than 2 seconds. No rash noted. No erythema.  Patient with 2 large lacerations to the left forearm. The first is parallel to the long axis of the forearm on the volar surface of the wrist extending into the midforearm. The second is perpendicular to the forearm on the dorsal surface. Subcutaneous fat is exposed. No visible tendon or muscle injury. Patient has 2 small lacerations on the dorsal surface near the wrist. No active bleeding. Small piece of glass removed from one of the lacerations.   Psychiatric: She has a normal mood and affect. Her behavior is normal.  Nursing note and vitals reviewed.    ED Treatments / Results  Labs (all labs ordered are listed, but only abnormal results are displayed) Labs Reviewed - No data to display  EKG  EKG Interpretation None       Radiology Dg Forearm Left  Result Date: 09/09/2016 CLINICAL DATA:  Laceration EXAM: LEFT FOREARM - 2 VIEW COMPARISON:  None. FINDINGS: No acute fracture. No dislocation. There is a soft tissue defect in the mid dorsal forearm. There are radiopaque foreign bodies within the soft tissue  injury. IMPRESSION: No acute bony injury. Soft tissue injury with radiopaque foreign bodies. Electronically Signed   By: Jolaine Click M.D.   On: 09/09/2016 16:30    Procedures Procedures (including critical care time)  Medications Ordered in ED Medications  oxyCODONE-acetaminophen (PERCOCET/ROXICET) 5-325 MG per tablet 1 tablet (1 tablet Oral Given 09/09/16 1534)  Tdap (BOOSTRIX) injection 0.5 mL (0.5 mLs Intramuscular Given 09/09/16 1534)  lidocaine-EPINEPHrine (XYLOCAINE W/EPI) 2 %-1:200000 (PF) injection 10 mL (10 mLs Infiltration Given by Other 09/09/16 1535)  bupivacaine (MARCAINE) 0.5 % injection 10 mL (10 mLs Infiltration Given by Other 09/09/16 1633)  cephALEXin (KEFLEX) capsule 500 mg (500 mg Oral Given 09/09/16 1931)     Initial Impression / Assessment and Plan / ED Course  I have reviewed the triage vital signs and the nursing notes.  Pertinent labs & imaging results  that were available during my care of the patient were reviewed by me and considered in my medical decision making (see chart for details).     Do not appreciate any tendon or muscle injury. X-ray to rule out foreign bodies or fracture. Will then anesthetized the area and more thoroughly explore and washout. Pain medication has been redosed. Given uncertainty as to the last tetanus will update. Discussed with Dr. Izora Ribas. Agrees with laceration closure and close follow-up for outpatient repair of the patient's tendon. We'll also start antibiotics. Please see PA note for laceration repair. Final Clinical Impressions(s) / ED Diagnoses   Final diagnoses:  Forearm laceration involving tendon, left, initial encounter    New Prescriptions Discharge Medication List as of 09/09/2016  6:55 PM    START taking these medications   Details  cephALEXin (KEFLEX) 500 MG capsule Take 1 capsule (500 mg total) by mouth 3 (three) times daily., Starting Wed 09/09/2016, Print    oxyCODONE-acetaminophen (PERCOCET) 5-325 MG tablet Take  1-2 tablets by mouth every 6 (six) hours as needed., Starting Wed 09/09/2016, Print         Loren Racer, MD 09/10/16 1510

## 2017-03-10 ENCOUNTER — Other Ambulatory Visit: Payer: Self-pay | Admitting: Obstetrics and Gynecology

## 2017-03-10 ENCOUNTER — Encounter (HOSPITAL_COMMUNITY): Payer: Self-pay | Admitting: Obstetrics and Gynecology

## 2017-03-10 ENCOUNTER — Other Ambulatory Visit: Payer: Self-pay

## 2017-03-10 DIAGNOSIS — O034 Incomplete spontaneous abortion without complication: Secondary | ICD-10-CM | POA: Diagnosis present

## 2017-03-10 DIAGNOSIS — J45909 Unspecified asthma, uncomplicated: Secondary | ICD-10-CM

## 2017-03-10 DIAGNOSIS — Z91040 Latex allergy status: Secondary | ICD-10-CM | POA: Diagnosis present

## 2017-03-10 NOTE — H&P (Signed)
[facility] 42 y.o. female, [id number] presenting on 03/11/17 for D&E due to incomplete miscarriage.  Patient noted positive home UPT just before Christmas, then began bleeding on Christmas Day.  She was seen in the office on 1/2, with US showing irregular shaped GS in LUS, no FP, with GS measuring 5 2/7 weeks.  She elected to treat the SAB with misoprostol, but there were no results after 2 doses on 1/8.    Repeat US done on 03/10/17 showed likely GS, no YS, c/w SAB, still measuring 5 2/7 weeks.  Patient requested D&E for definitive treatment of the SAB, due to persistent bleeding and cramping without resolution.  Patient Active Problem List   Diagnosis Date Noted  . Incomplete miscarriage 03/10/2017  . Asthma 03/10/2017  . 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 Asthma    seasonal allergy induced  . Chronic anemia 01/15/2012  . Osteopenia   . Placenta previa   . Placenta previa without hemorrhage 11/05/2011  . Postpartum care following cesarean delivery 01/12/2012  . S/P cesarean section (11/12) 01/12/2012  . Vitamin D deficiency    Past Surgical History:  Procedure Laterality Date  . CESAREAN SECTION  01/12/2012   Procedure: CESAREAN SECTION;  Surgeon: Natalie KyleSheronette A Cousins, MD;  Location: [facility];  Service: Obstetrics;  Laterality: N/A;  Primary C/S.  EDD: 02/02/12  . DILATION AND CURETTAGE OF UTERUS    Breast reduction 2016  Family History: family history includes Arthritis in her maternal grandmother and paternal grandmother; Asthma in her paternal grandfather; Cancer in her paternal aunt and paternal uncle; Diabetes in her father, maternal grandmother, and paternal grandmother; Heart  disease in her maternal grandmother; Hypertension in her maternal grandmother and paternal grandmother; Stroke in her maternal aunt; Vision loss in her father.   Social History:  reports that  has never smoked. She does not have any smokeless tobacco history on file. She reports that she does not drink alcohol or use drugs.   ROS:  Small amount brown spotting, mild cramping at times  Allergies  Allergen Reactions  . Morphine Anaphylaxis  . Morphine And Related Shortness Of Breath    Pt is able to take percocet without problems.  . Pollen Extract Hives  . Latex      Chest clear Heart RRR without murmur Abd soft, NT Pelvic: Per Dr. Mora ApplPinn on 03/10/17, small amount old blood in vault, cervix closed. Ext: WNL    Assessment/Plan: Incomplete SAB--patient requests D&E O+  [facility] Dr. Normand Sloopillard Routine [facility] pre-op orders Support to patient for loss.  Natalie BridgemanVicki Tyshae Ross CNM, [place] 03/11/2017, 7:58 AM

## 2017-03-11 ENCOUNTER — Ambulatory Visit (HOSPITAL_COMMUNITY): Payer: Medicaid Other

## 2017-03-11 ENCOUNTER — Other Ambulatory Visit: Payer: Self-pay

## 2017-03-11 ENCOUNTER — Ambulatory Visit (HOSPITAL_COMMUNITY): Payer: Medicaid Other | Admitting: Anesthesiology

## 2017-03-11 ENCOUNTER — Encounter (HOSPITAL_COMMUNITY): Payer: Self-pay

## 2017-03-11 ENCOUNTER — Ambulatory Visit (HOSPITAL_COMMUNITY)
Admission: RE | Admit: 2017-03-11 | Discharge: 2017-03-11 | Disposition: A | Discharge status: Home / Self Care | Payer: Medicaid Other | Source: Ambulatory Visit | Attending: Obstetrics and Gynecology | Admitting: Obstetrics and Gynecology

## 2017-03-11 ENCOUNTER — Encounter (HOSPITAL_COMMUNITY)
Admission: RE | Disposition: A | Discharge status: Home / Self Care | Payer: Self-pay | Source: Ambulatory Visit | Attending: Obstetrics and Gynecology

## 2017-03-11 DIAGNOSIS — O4400 Placenta previa specified as without hemorrhage, unspecified trimester: Secondary | ICD-10-CM | POA: Diagnosis not present

## 2017-03-11 DIAGNOSIS — E559 Vitamin D deficiency, unspecified: Secondary | ICD-10-CM | POA: Insufficient documentation

## 2017-03-11 DIAGNOSIS — Z885 Allergy status to narcotic agent status: Secondary | ICD-10-CM | POA: Insufficient documentation

## 2017-03-11 DIAGNOSIS — O034 Incomplete spontaneous abortion without complication: Secondary | ICD-10-CM | POA: Diagnosis present

## 2017-03-11 DIAGNOSIS — M8580 Other specified disorders of bone density and structure, unspecified site: Secondary | ICD-10-CM | POA: Insufficient documentation

## 2017-03-11 DIAGNOSIS — Z888 Allergy status to other drugs, medicaments and biological substances status: Secondary | ICD-10-CM | POA: Diagnosis not present

## 2017-03-11 DIAGNOSIS — Z91040 Latex allergy status: Secondary | ICD-10-CM | POA: Diagnosis present

## 2017-03-11 DIAGNOSIS — Z419 Encounter for procedure for purposes other than remedying health state, unspecified: Secondary | ICD-10-CM

## 2017-03-11 DIAGNOSIS — J45909 Unspecified asthma, uncomplicated: Secondary | ICD-10-CM

## 2017-03-11 HISTORY — PX: OPERATIVE ULTRASOUND: SHX5996

## 2017-03-11 HISTORY — PX: DILATION AND EVACUATION: SHX1459

## 2017-03-11 LAB — CBC
HEMATOCRIT: 36.1 % (ref 36.0–46.0)
Hemoglobin: 12 g/dL (ref 12.0–15.0)
MCH: 28.1 pg (ref 26.0–34.0)
MCHC: 33.2 g/dL (ref 30.0–36.0)
MCV: 84.5 fL (ref 78.0–100.0)
Platelets: 368 10*3/uL (ref 150–400)
RBC: 4.27 MIL/uL (ref 3.87–5.11)
RDW: 15 % (ref 11.5–15.5)
WBC: 8.6 10*3/uL (ref 4.0–10.5)

## 2017-03-11 LAB — TYPE AND SCREEN
ABO/RH(D): O POS
ANTIBODY SCREEN: NEGATIVE

## 2017-03-11 LAB — SURGICAL PCR SCREEN
MRSA, PCR: NEGATIVE
STAPHYLOCOCCUS AUREUS: POSITIVE — AB

## 2017-03-11 SURGERY — DILATION AND EVACUATION, UTERUS
Anesthesia: General

## 2017-03-11 MED ORDER — PROMETHAZINE HCL 25 MG/ML IJ SOLN: 6.2500 mg | INTRAMUSCULAR | Status: DC | PRN

## 2017-03-11 MED ORDER — SCOPOLAMINE 1 MG/3DAYS TD PT72
MEDICATED_PATCH | TRANSDERMAL | Status: AC
  Administered 2017-03-11: 1.5 mg via TRANSDERMAL
  Filled 2017-03-11: qty 1

## 2017-03-11 MED ORDER — PROPOFOL 10 MG/ML IV BOLUS
INTRAVENOUS | Status: DC | PRN
  Administered 2017-03-11: 200 mg via INTRAVENOUS

## 2017-03-11 MED ORDER — KETOROLAC TROMETHAMINE 30 MG/ML IJ SOLN
INTRAMUSCULAR | Status: AC
  Filled 2017-03-11: qty 1

## 2017-03-11 MED ORDER — ONDANSETRON HCL 4 MG/2ML IJ SOLN
INTRAMUSCULAR | Status: AC
  Filled 2017-03-11: qty 2

## 2017-03-11 MED ORDER — MUPIROCIN CALCIUM 2 % NA OINT: 1.0000 "application " | TOPICAL_OINTMENT | Freq: Two times a day (BID) | NASAL | 0 refills | Status: DC

## 2017-03-11 MED ORDER — OXYCODONE HCL 5 MG PO TABS: 5.0000 mg | ORAL_TABLET | Freq: Once | ORAL | Status: DC | PRN

## 2017-03-11 MED ORDER — OXYCODONE HCL 5 MG/5ML PO SOLN: 5.0000 mg | Freq: Once | ORAL | Status: DC | PRN

## 2017-03-11 MED ORDER — LIDOCAINE HCL 2 % IJ SOLN
INTRAMUSCULAR | Status: AC
  Filled 2017-03-11: qty 20

## 2017-03-11 MED ORDER — LIDOCAINE HCL 2 % IJ SOLN
INTRAMUSCULAR | Status: DC | PRN
  Administered 2017-03-11: 20 mL

## 2017-03-11 MED ORDER — DEXAMETHASONE SODIUM PHOSPHATE 4 MG/ML IJ SOLN
INTRAMUSCULAR | Status: DC | PRN
  Administered 2017-03-11: 4 mg via INTRAVENOUS

## 2017-03-11 MED ORDER — ONDANSETRON HCL 4 MG/2ML IJ SOLN
INTRAMUSCULAR | Status: DC | PRN
  Administered 2017-03-11: 4 mg via INTRAVENOUS

## 2017-03-11 MED ORDER — MIDAZOLAM HCL 2 MG/2ML IJ SOLN
INTRAMUSCULAR | Status: AC
Start: 2017-03-11 — End: 2017-03-11
  Filled 2017-03-11: qty 2

## 2017-03-11 MED ORDER — DOXYCYCLINE HYCLATE 50 MG PO CAPS: 100.0000 mg | ORAL_CAPSULE | Freq: Two times a day (BID) | ORAL | 0 refills | Status: DC

## 2017-03-11 MED ORDER — PROPOFOL 10 MG/ML IV BOLUS
INTRAVENOUS | Status: AC
  Filled 2017-03-11: qty 20

## 2017-03-11 MED ORDER — LACTATED RINGERS IV SOLN
INTRAVENOUS | Status: DC
  Administered 2017-03-11: 125 mL/h via INTRAVENOUS

## 2017-03-11 MED ORDER — LIDOCAINE HCL (CARDIAC) 20 MG/ML IV SOLN
INTRAVENOUS | Status: DC | PRN
  Administered 2017-03-11: 50 mg via INTRAVENOUS

## 2017-03-11 MED ORDER — FENTANYL CITRATE (PF) 100 MCG/2ML IJ SOLN
INTRAMUSCULAR | Status: DC | PRN
  Administered 2017-03-11: 50 ug via INTRAVENOUS

## 2017-03-11 MED ORDER — LIDOCAINE HCL (CARDIAC) 20 MG/ML IV SOLN
INTRAVENOUS | Status: AC
  Filled 2017-03-11: qty 5

## 2017-03-11 MED ORDER — MIDAZOLAM HCL 2 MG/2ML IJ SOLN
INTRAMUSCULAR | Status: DC | PRN
  Administered 2017-03-11: 2 mg via INTRAVENOUS

## 2017-03-11 MED ORDER — MUPIROCIN 2 % EX OINT
TOPICAL_OINTMENT | CUTANEOUS | Status: AC
  Filled 2017-03-11: qty 22

## 2017-03-11 MED ORDER — HYDROMORPHONE HCL 1 MG/ML IJ SOLN
INTRAMUSCULAR | Status: AC
  Administered 2017-03-11: 0.5 mg via INTRAVENOUS
  Filled 2017-03-11: qty 1

## 2017-03-11 MED ORDER — MEPERIDINE HCL 25 MG/ML IJ SOLN: 6.2500 mg | INTRAMUSCULAR | Status: DC | PRN

## 2017-03-11 MED ORDER — HYDROMORPHONE HCL 1 MG/ML IJ SOLN
0.2500 mg | INTRAMUSCULAR | Status: DC | PRN
  Administered 2017-03-11: 0.5 mg via INTRAVENOUS
  Administered 2017-03-11 (×2): 0.25 mg via INTRAVENOUS

## 2017-03-11 MED ORDER — SCOPOLAMINE 1 MG/3DAYS TD PT72
1.0000 | MEDICATED_PATCH | Freq: Once | TRANSDERMAL | Status: DC
  Administered 2017-03-11: 1.5 mg via TRANSDERMAL

## 2017-03-11 MED ORDER — FENTANYL CITRATE (PF) 100 MCG/2ML IJ SOLN
INTRAMUSCULAR | Status: AC
  Filled 2017-03-11: qty 2

## 2017-03-11 MED ORDER — DEXAMETHASONE SODIUM PHOSPHATE 4 MG/ML IJ SOLN
INTRAMUSCULAR | Status: AC
  Filled 2017-03-11: qty 1

## 2017-03-11 SURGICAL SUPPLY — 19 items
CATH ROBINSON RED A/P 16FR (CATHETERS) ×2 IMPLANT
DECANTER SPIKE VIAL GLASS SM (MISCELLANEOUS) ×2 IMPLANT
DILATOR CANAL MILEX (MISCELLANEOUS) IMPLANT
GLOVE BIO SURGEON STRL SZ 6.5 (GLOVE) ×2 IMPLANT
GLOVE BIOGEL PI IND STRL 7.0 (GLOVE) ×3 IMPLANT
GLOVE BIOGEL PI INDICATOR 7.0 (GLOVE) ×3
GOWN STRL REUS W/TWL LRG LVL3 (GOWN DISPOSABLE) ×4 IMPLANT
KIT BERKELEY 1ST TRIMESTER 3/8 (MISCELLANEOUS) ×2 IMPLANT
NS IRRIG 1000ML POUR BTL (IV SOLUTION) ×2 IMPLANT
PACK VAGINAL MINOR WOMEN LF (CUSTOM PROCEDURE TRAY) ×2 IMPLANT
PAD OB MATERNITY 4.3X12.25 (PERSONAL CARE ITEMS) ×2 IMPLANT
PAD PREP 24X48 CUFFED NSTRL (MISCELLANEOUS) ×2 IMPLANT
SET BERKELEY SUCTION TUBING (SUCTIONS) ×2 IMPLANT
SYR 20CC LL (SYRINGE) ×2 IMPLANT
TOWEL OR 17X24 6PK STRL BLUE (TOWEL DISPOSABLE) ×4 IMPLANT
VACURETTE 10 RIGID CVD (CANNULA) IMPLANT
VACURETTE 7MM CVD STRL WRAP (CANNULA) ×2 IMPLANT
VACURETTE 8 RIGID CVD (CANNULA) IMPLANT
VACURETTE 9 RIGID CVD (CANNULA) IMPLANT

## 2017-03-11 NOTE — Anesthesia Preprocedure Evaluation (Signed)
Anesthesia Evaluation  Patient identified by MRN, date of birth, ID band Patient awake    Reviewed: Allergy & Precautions, H&P , NPO status , Patient's Chart, lab work & pertinent test results  Airway Mallampati: I  TM Distance: >3 FB Neck ROM: full    Dental no notable dental hx.    Pulmonary asthma ,    Pulmonary exam normal breath sounds clear to auscultation       Cardiovascular negative cardio ROS Normal cardiovascular exam Rhythm:Regular Rate:Normal     Neuro/Psych negative neurological ROS  negative psych ROS   GI/Hepatic negative GI ROS, Neg liver ROS,   Endo/Other  negative endocrine ROS  Renal/GU negative Renal ROS  negative genitourinary   Musculoskeletal negative musculoskeletal ROS (+)   Abdominal Normal abdominal exam  (+)   Peds negative pediatric ROS (+)  Hematology negative hematology ROS (+)   Anesthesia Other Findings   Reproductive/Obstetrics                             Anesthesia Physical  Anesthesia Plan  ASA: II  Anesthesia Plan: General   Post-op Pain Management:    Induction: Intravenous  PONV Risk Score and Plan: 3 and Ondansetron, Dexamethasone and Midazolam  Airway Management Planned: LMA  Additional Equipment:   Intra-op Plan:   Post-operative Plan: Extubation in OR  Informed Consent: I have reviewed the patients History and Physical, chart, labs and discussed the procedure including the risks, benefits and alternatives for the proposed anesthesia with the patient or authorized representative who has indicated his/her understanding and acceptance.   Dental advisory given  Plan Discussed with: CRNA and Surgeon  Anesthesia Plan Comments:         Anesthesia Quick Evaluation

## 2017-03-11 NOTE — Transfer of Care (Signed)
Immediate Anesthesia Transfer of Care Note  Patient: [employer]lett-[employer]ardYavonda Soliman  Procedure(s) Performed: SUCTION DILATATION AND EVACUATION (N/A ) OPERATIVE ULTRASOUND (N/A )  Patient Location: PACU  Anesthesia Type:General  Level of Consciousness: awake, alert  and oriented  Airway & Oxygen Therapy: Patient Spontanous Breathing and Patient connected to nasal cannula oxygen  Post-op Assessment: Report given to RN and Post -op Vital signs reviewed and stable  Post vital signs: Reviewed and stable  Last Vitals:  Vitals:   03/11/17 1310 03/11/17 1422  BP: 126/75 (P) 138/79  Pulse: 67 (P) 62  Resp: 16 (P) 16  Temp: 36.7 C   SpO2: 100% (P) 100%    Last Pain:  Vitals:   03/11/17 1310  TempSrc: Oral      Patients Stated Pain Goal: 6 (03/11/17 1310)  Complications: No apparent anesthesia complications

## 2017-03-11 NOTE — Anesthesia Procedure Notes (Signed)
Procedure Name: LMA Insertion Date/Time: 03/11/2017 1:40 PM Performed by: Cleda ClarksBrowder, Tarick Parenteau R, CRNA Pre-anesthesia Checklist: Patient identified, Emergency Drugs available, Suction available and Patient being monitored Patient Re-evaluated:Patient Re-evaluated prior to induction Oxygen Delivery Method: Circle system utilized Preoxygenation: Pre-oxygenation with 100% oxygen Induction Type: IV induction Ventilation: Mask ventilation without difficulty LMA: LMA inserted LMA Size: 4.0 Tube size: 4.0 mm Number of attempts: 1 Airway Equipment and Method: Bite block Placement Confirmation: positive ETCO2 Tube secured with: Tape Dental Injury: Teeth and Oropharynx as per pre-operative assessment

## 2017-03-11 NOTE — Anesthesia Postprocedure Evaluation (Signed)
Anesthesia Post Note  Patient: [name]  Procedure(s) Performed: SUCTION DILATATION AND EVACUATION (N/A ) OPERATIVE ULTRASOUND (N/A )     Patient location during evaluation: PACU Anesthesia Type: General Level of consciousness: awake and alert Pain management: pain level controlled Vital Signs Assessment: post-procedure vital signs reviewed and stable Respiratory status: spontaneous breathing, nonlabored ventilation and respiratory function stable Cardiovascular status: blood pressure returned to baseline and stable Postop Assessment: no apparent nausea or vomiting Anesthetic complications: no    Last Vitals:  Vitals:   03/11/17 1500 03/11/17 1515  BP: 118/71 118/73  Pulse: (!) 59 (!) 58  Resp: 10 10  Temp:    SpO2: 100%     Last Pain:  Vitals:   03/11/17 1515  TempSrc:   PainSc: Asleep   Pain Goal: Patients Stated Pain Goal: 6 (03/11/17 1500)               [name]

## 2017-03-11 NOTE — Discharge Instructions (Signed)
Post Anesthesia Home Care Instructions  Activity: Get plenty of rest for the remainder of the day. A responsible individual must stay with you for 24 hours following the procedure.  For the next 24 hours, DO NOT: -Drive a car -Advertising copywriterperate machinery -Drink alcoholic beverages -Take any medication unless instructed by your physician -Make any legal decisions or sign important papers.  Meals: Start with liquid foods such as gelatin or soup. Progress to regular foods as tolerated. Avoid greasy, spicy, heavy foods. If nausea and/or vomiting occur, drink only clear liquids until the nausea and/or vomiting subsides. Call your physician if vomiting continues.  Special Instructions/Symptoms: Your throat may feel dry or sore from the anesthesia or the breathing tube placed in your throat during surgery. If this causes discomfort, gargle with warm salt water. The discomfort should disappear within 24 hours.  If you had a scopolamine patch placed behind your ear for the management of post- operative nausea and/or vomiting:  1. The medication in the patch is effective for 72 hours, after which it should be removed.  Wrap patch in a tissue and discard in the trash. Wash hands thoroughly with soap and water. 2. You may remove the patch earlier than 72 hours if you experience unpleasant side effects which may include dry mouth, dizziness or visual disturbances. 3. Avoid touching the patch. Wash your hands with soap and water after contact with the patch.   Incomplete Miscarriage A miscarriage is the sudden loss of an unborn baby (fetus) before the 20th week of pregnancy. In an incomplete miscarriage, parts of the fetus or placenta (afterbirth) remain in the body. Having a miscarriage can be an emotional experience. Talk with your health care provider about any questions you may have about miscarrying, the grieving process, and your future pregnancy plans. What are the causes?  Problems with the fetal  chromosomes that make it impossible for the baby to develop normally. Problems with the baby's genes or chromosomes are most often the result of errors that occur by chance as the embryo divides and grows. The problems are not inherited from the parents.  Infection of the cervix or uterus.  Hormone problems.  Problems with the cervix, such as having an incompetent cervix. This is when the tissue in the cervix is not strong enough to hold the pregnancy.  Problems with the uterus, such as an abnormally shaped uterus, uterine fibroids, or congenital abnormalities.  Certain medical conditions.  Smoking, drinking alcohol, or taking illegal drugs.  Trauma. What are the signs or symptoms?  Vaginal bleeding or spotting, with or without cramps or pain.  Pain or cramping in the abdomen or lower back.  Passing fluid, tissue, or blood clots from the vagina. How is this diagnosed? Your health care provider will perform a physical exam. You may also have an ultrasound to confirm the miscarriage. Blood or urine tests may also be ordered. How is this treated?  Usually, a dilation and curettage (D&C) procedure is performed. During a D&C procedure, the cervix is widened (dilated) and any remaining fetal or placental tissue is gently removed from the uterus.  Antibiotic medicines are prescribed if there is an infection. Other medicines may be given to reduce the size of the uterus (contract) if there is a lot of bleeding.  If you have Rh negative blood and your baby was Rh positive, you will need a Rho (D) immune globulin shot. This shot will protect any future baby from having Rh blood problems in future pregnancies.  You may be confined to bed rest. This means you should stay in bed and only get up to use the bathroom. Follow these instructions at home:  Rest as directed by your health care provider.  Restrict activity as directed by your health care provider. You may be allowed to continue light  activity if curettage was not done but you require further treatment.  Keep track of the number of pads you use each day. Keep track of how soaked (saturated) they are. Record this information.  Do not  use tampons.  Do not douche or have sexual intercourse until approved by your health care provider.  Keep all follow-up appointments for reevaluation and continuing management.  Only take over-the-counter or prescription medicines for pain, fever, or discomfort as directed by your health care provider.  Take antibiotic medicine as directed by your health care provider. Make sure you finish it even if you start to feel better. Get help right away if:  You experience severe cramps in your stomach, back, or abdomen.  You have an unexplained temperature (make sure to record these temperatures).  You pass large clots or tissue (save these for your health care provider to inspect).  Your bleeding increases.  You become light-headed, weak, or have fainting episodes. This information is not intended to replace advice given to you by your health care provider. Make sure you discuss any questions you have with your health care provider. Document Released: 02/16/2005 Document Revised: 07/25/2015 Document Reviewed: 09/15/2012 Elsevier Interactive Patient Education  2017 [employer].

## 2017-03-11 NOTE — Op Note (Signed)
Pre op DX :  Incomplete abortion Postoperative Diagnosis: same Procedure:  dilation and evacuation Anesthesia:  MAC and local Surgeon:  Dr. Evelyn Aguinaldo Asst : none Complications : none Procedure in detail: The patient was taken to the operating room where she was given MAC anesthesia. She was placed in dorsal lithotomy position and prepped and draped in normal sterile fashion. In and out catheter was used to drain the bladder. This was examined and noted to have a 7 week size uterus with no adnexal masses. A bivalve was placed into the vagina. A tenaculum was placed on the cervix. The cervix was infiltrated with 20 cc 1% lidocaine paracervical block. The cervix then dilated with dilators up to 21.  A size 7 suction curettage was placed into the uterine cavity. A scant amount of products of conception was seen. The suction curettage was removed when a gritty texture was noted. A sharp curette was done along all walls  of the uterus. The suction curet was placed back into the uterine cavity. No further products of conception were obtained. Sponge lap and needle counts were correct. Patient back in stable condition. The patient understood to be the risks to be, but not  limited to,  Bleeding,  Infection,  damage to internal organs by perforation of the uterus and Asherman syndrome (scarring in the uterus) leading to infertility.   

## 2017-03-12 ENCOUNTER — Encounter (HOSPITAL_COMMUNITY): Payer: Self-pay | Admitting: Obstetrics and Gynecology

## 2020-03-14 ENCOUNTER — Other Ambulatory Visit: Payer: Self-pay

## 2020-03-14 ENCOUNTER — Emergency Department (HOSPITAL_BASED_OUTPATIENT_CLINIC_OR_DEPARTMENT_OTHER): Payer: Medicaid Other

## 2020-03-14 ENCOUNTER — Encounter (HOSPITAL_BASED_OUTPATIENT_CLINIC_OR_DEPARTMENT_OTHER): Payer: Self-pay

## 2020-03-14 DIAGNOSIS — U071 COVID-19: Secondary | ICD-10-CM | POA: Diagnosis not present

## 2020-03-14 DIAGNOSIS — J45909 Unspecified asthma, uncomplicated: Secondary | ICD-10-CM | POA: Insufficient documentation

## 2020-03-14 DIAGNOSIS — Z91040 Latex allergy status: Secondary | ICD-10-CM | POA: Diagnosis not present

## 2020-03-14 LAB — PREGNANCY, URINE: Preg Test, Ur: NEGATIVE

## 2020-03-14 LAB — CBC
HCT: 40 % (ref 36.0–46.0)
Hemoglobin: 13.2 g/dL (ref 12.0–15.0)
MCH: 27.8 pg (ref 26.0–34.0)
MCHC: 33 g/dL (ref 30.0–36.0)
MCV: 84.2 fL (ref 80.0–100.0)
Platelets: 325 10*3/uL (ref 150–400)
RBC: 4.75 MIL/uL (ref 3.87–5.11)
RDW: 14.3 % (ref 11.5–15.5)
WBC: 5.5 10*3/uL (ref 4.0–10.5)
nRBC: 0 % (ref 0.0–0.2)

## 2020-03-14 LAB — BASIC METABOLIC PANEL
Anion gap: 10 (ref 5–15)
BUN: 9 mg/dL (ref 6–20)
CO2: 22 mmol/L (ref 22–32)
Calcium: 9.2 mg/dL (ref 8.9–10.3)
Chloride: 105 mmol/L (ref 98–111)
Creatinine, Ser: 0.89 mg/dL (ref 0.44–1.00)
GFR, Estimated: >60 mL/min (ref >60)
Glucose, Bld: 124 mg/dL — ABNORMAL HIGH (ref 70–99)
Potassium: 3.9 mmol/L (ref 3.5–5.1)
Sodium: 137 mmol/L (ref 135–145)

## 2020-03-14 LAB — TROPONIN I (HIGH SENSITIVITY): Troponin I (High Sensitivity): <2 ng/L (ref <18)

## 2020-03-14 NOTE — ED Triage Notes (Signed)
Pt c/o CP x today-pt with multiple c/o-states she had +covid test [facility] today and was started on zithromax and prednisone-NAD-steady gait

## 2020-03-15 ENCOUNTER — Emergency Department (HOSPITAL_BASED_OUTPATIENT_CLINIC_OR_DEPARTMENT_OTHER)
Admission: EM | Admit: 2020-03-15 | Discharge: 2020-03-15 | Disposition: A | Discharge status: Home / Self Care | Payer: Medicaid Other | Attending: Emergency Medicine | Admitting: Emergency Medicine

## 2020-03-15 DIAGNOSIS — R0789 Other chest pain: Secondary | ICD-10-CM

## 2020-03-15 DIAGNOSIS — U071 COVID-19: Secondary | ICD-10-CM

## 2020-03-15 NOTE — ED Provider Notes (Signed)
[facility] Provider Note: [name], MD, FACEP  CSN: [id number] MRN: [id number] ARRIVAL: 03/14/20 at 2154 ROOM: MH10/MH10   CHIEF COMPLAINT  Chest Pain   HISTORY OF PRESENT ILLNESS  03/15/20 1:26 AM Natalie Ross is a 45 y.o. female with 2 to 3-day history of nasal congestion and cough.  She also has felt hot but has not documented a fever.  Yesterday she went to an urgent care and was tested for COVID and found to be positive.  She was started on Zithromax and prednisone.  Since yesterday she has had chest pain which is in her lower chest.  It is dull and rated as an 8 out of 10.  It is worse with coughing or deep breath.  It is not well localized.  She has had no nausea, vomiting or diarrhea.  She has had no loss of taste or smell.    Past Medical History:  Diagnosis Date  . Asthma    seasonal allergy induced  . Chronic anemia 01/15/2012  . Osteopenia   . Placenta previa without hemorrhage 11/05/2011  . Postpartum care following cesarean delivery 01/12/2012  . S/P cesarean section (11/12) 01/12/2012  . Vitamin D deficiency     Past Surgical History:  Procedure Laterality Date  . CESAREAN SECTION  01/12/2012   Procedure: CESAREAN SECTION;  Surgeon: Serita Kyle, MD;  Location: [facility];  Service: Obstetrics;  Laterality: N/A;  Primary C/S.  EDD: 02/02/12  . DILATION AND CURETTAGE OF UTERUS    . DILATION AND EVACUATION N/A 03/11/2017   Procedure: SUCTION DILATATION AND EVACUATION;  Surgeon: Jaymes Graff, MD;  Location: [facility];  Service: Gynecology;  Laterality: N/A;  . OPERATIVE ULTRASOUND N/A 03/11/2017   Procedure: OPERATIVE ULTRASOUND;  Surgeon: Jaymes Graff, MD;  Location: [facility];  Service: Gynecology;  Laterality: N/A;    Family History  Problem Relation Age of Onset  . Diabetes Father   . Vision loss Father   . Stroke Maternal Aunt   . Cancer Paternal Aunt   . Cancer Paternal Uncle   . Hypertension Maternal Grandmother   . Diabetes  Maternal Grandmother   . Heart disease Maternal Grandmother   . Arthritis Maternal Grandmother   . Hypertension Paternal Grandmother   . Diabetes Paternal Grandmother   . Arthritis Paternal Grandmother   . Asthma Paternal Grandfather     Social History   Tobacco Use  . Smoking status: Never Smoker  . Smokeless tobacco: Never Used  Vaping Use  . Vaping Use: Never used  Substance Use Topics  . Alcohol use: No  . Drug use: No    Prior to Admission medications   Medication Sig Start Date End Date Taking? Authorizing Provider  mupirocin nasal ointment (BACTROBAN NASAL) 2 % Place 1 application into the nose 2 (two) times daily. Use one-half of tube in each nostril twice daily for five (5) days. After application, press sides of nose together and gently massage. 03/11/17 03/15/20  Jaymes Graff, MD    Allergies Morphine and related, Latex, and Pollen extract   REVIEW OF SYSTEMS  Negative except as noted here or in the History of Present Illness.   PHYSICAL EXAMINATION  Initial Vital Signs Blood pressure (!) 148/97, pulse (!) 108, temperature 98.9 F (37.2 C), temperature source Oral, resp. rate 18, height 5\' 3"  (1.6 m), weight 78 kg, last menstrual period 02/29/2020, SpO2 100 %, currently breastfeeding.  Examination General: Well-developed, well-nourished female in no acute distress; appearance consistent with  age of record HENT: normocephalic; atraumatic Eyes: pupils equal, round and reactive to light; extraocular muscles intact Neck: supple Heart: regular rate and rhythm Lungs: clear to auscultation bilaterally Chest: Nontender Abdomen: soft; nondistended; nontender; bowel sounds present Extremities: No deformity; full range of motion; pulses normal Neurologic: Awake, alert and oriented; motor function intact in all extremities and symmetric; no facial droop Skin: Warm and dry Psychiatric: Normal mood and affect   RESULTS  Summary of this visit's results, reviewed and  interpreted by myself:   EKG Interpretation  Date/Time:  Thursday March 14 2020 22:05:07 EST Ventricular Rate:  100 PR Interval:  124 QRS Duration: 74 QT Interval:  298 QTC Calculation: 384 R Axis:   52 Text Interpretation: Normal sinus rhythm Nonspecific T wave abnormality Abnormal ECG Rate is faster Confirmed by Yoseph Haile, Jonny Ruiz ([id number]) on 03/15/2020 1:26:09 AM      Laboratory Studies: Results for orders placed or performed during the hospital encounter of 03/15/20 (from the past 24 hour(s))  Basic metabolic panel     Status: Abnormal   Collection Time: 03/14/20 10:51 PM  Result Value Ref Range   Sodium 137 135 - 145 mmol/L   Potassium 3.9 3.5 - 5.1 mmol/L   Chloride 105 98 - 111 mmol/L   CO2 22 22 - 32 mmol/L   Glucose, Bld 124 (H) 70 - 99 mg/dL   BUN 9 6 - 20 mg/dL   Creatinine, Ser 9.15 0.44 - 1.00 mg/dL   Calcium 9.2 8.9 - 05.6 mg/dL   GFR, Estimated >97 >94 mL/min   Anion gap 10 5 - 15  CBC     Status: None   Collection Time: 03/14/20 10:51 PM  Result Value Ref Range   WBC 5.5 4.0 - 10.5 K/uL   RBC 4.75 3.87 - 5.11 MIL/uL   Hemoglobin 13.2 12.0 - 15.0 g/dL   HCT 80.1 65.5 - 37.4 %   MCV 84.2 80.0 - 100.0 fL   MCH 27.8 26.0 - 34.0 pg   MCHC 33.0 30.0 - 36.0 g/dL   RDW 82.7 07.8 - 67.5 %   Platelets 325 150 - 400 K/uL   nRBC 0.0 0.0 - 0.2 %  Troponin I (High Sensitivity)     Status: None   Collection Time: 03/14/20 10:51 PM  Result Value Ref Range   Troponin I (High Sensitivity) <2 <18 ng/L  Pregnancy, urine     Status: None   Collection Time: 03/14/20 10:53 PM  Result Value Ref Range   Preg Test, Ur NEGATIVE NEGATIVE   Imaging Studies: DG Chest Portable 1 View  Result Date: 03/14/2020 CLINICAL DATA:  Chest pain and shortness of breath, positive COVID test today. EXAM: PORTABLE CHEST 1 VIEW COMPARISON:  Chest radiograph August 02, 2014 FINDINGS: The heart size and mediastinal contours are within normal limits. No focal consolidation. No pleural effusion or  pneumothorax. The visualized skeletal structures are unremarkable. IMPRESSION: No acute cardiopulmonary disease. Electronically Signed   By: Maudry Mayhew MD   On: 03/14/2020 23:02    ED COURSE and MDM  Nursing notes, initial and subsequent vitals signs, including pulse oximetry, reviewed and interpreted by myself.  Vitals:   03/14/20 2215 03/15/20 0117  BP: (!) 148/97 139/81  Pulse: (!) 108 66  Resp: 18 17  Temp: 98.9 F (37.2 C) 98.9 F (37.2 C)  TempSrc: Oral Oral  SpO2: 100% 100%  Weight: 78 kg   Height: 5\' 3"  (1.6 m)    Medications - No data to display  The patient's chest pain is worse with deep breathing or coughing and this is consistent with what we have been seeing in COVID patients recently.  This may be more specific to omicron than previous strains.  Since she is on steroids we will avoid NSAIDs but she should be able to take acetaminophen without difficulty.  PROCEDURES  Procedures   ED DIAGNOSES     ICD-10-CM   1. Chest wall pain  R07.89   2. COVID-19 virus infection  U07.1        Evi Mccomb, MD 03/15/20 [phone]

## 2020-11-10 ENCOUNTER — Emergency Department (HOSPITAL_BASED_OUTPATIENT_CLINIC_OR_DEPARTMENT_OTHER): Payer: Medicaid Other

## 2020-11-10 ENCOUNTER — Other Ambulatory Visit: Payer: Self-pay

## 2020-11-10 ENCOUNTER — Emergency Department (HOSPITAL_BASED_OUTPATIENT_CLINIC_OR_DEPARTMENT_OTHER)
Admission: EM | Admit: 2020-11-10 | Discharge: 2020-11-11 | Disposition: A | Discharge status: Home / Self Care | Payer: Medicaid Other | Attending: Emergency Medicine | Admitting: Emergency Medicine

## 2020-11-10 ENCOUNTER — Encounter (HOSPITAL_BASED_OUTPATIENT_CLINIC_OR_DEPARTMENT_OTHER): Payer: Self-pay | Admitting: *Deleted

## 2020-11-10 DIAGNOSIS — J45909 Unspecified asthma, uncomplicated: Secondary | ICD-10-CM | POA: Insufficient documentation

## 2020-11-10 DIAGNOSIS — J069 Acute upper respiratory infection, unspecified: Secondary | ICD-10-CM

## 2020-11-10 DIAGNOSIS — R059 Cough, unspecified: Secondary | ICD-10-CM

## 2020-11-10 MED ORDER — BENZONATATE 100 MG PO CAPS: 100.0000 mg | ORAL_CAPSULE | Freq: Three times a day (TID) | ORAL | 0 refills | Status: AC

## 2020-11-10 NOTE — ED Triage Notes (Signed)
Pt reports productive cough and fever x 4 days. She had neg covid/flu test today at [facility]. States "when I cough it feels like something is pricking me"

## 2020-11-10 NOTE — Discharge Instructions (Addendum)
You were evaluated in the Emergency Department and after careful evaluation, we did not find any emergent condition requiring admission or further testing in the hospital.  Your exam/testing today was overall reassuring.  EKG and chest x-ray are normal.  Symptoms seem to be due to a viral illness.  Use the Tessalon medication as needed for cough.  Should be feeling better over the next few days.  Please return to the Emergency Department if you experience any worsening of your condition.  Thank you for allowing [place] to be a part of your care.

## 2020-11-10 NOTE — ED Provider Notes (Signed)
[facility] Emergency Department Provider Note MRN:  [id number]  Arrival date & time: 11/10/20     Chief Complaint   Cough   History of Present Illness   Natalie Ross is a 45 y.o. year-old female with no pertinent past medical history presenting to the ED with chief complaint of cough.  Cough, malaise, fatigue, intermittent fever, body aches for the past 5 days.  Tested negative for COVID and flu yesterday.  Feels some soreness in the chest with coughing, feels like pins-and-needles in the chest.  Denies any shortness of breath, no abdominal pain, no nausea vomiting or diarrhea.  Symptoms are constant, mild to moderate, no exacerbating or relieving factors.  Review of Systems  A complete 10 system review of systems was obtained and all systems are negative except as noted in the HPI and PMH.   Patient's Health History    Past Medical History:  Diagnosis Date   Asthma    seasonal allergy induced   Chronic anemia 01/15/2012   Osteopenia    Placenta previa without hemorrhage 11/05/2011   Postpartum care following cesarean delivery 01/12/2012   S/P cesarean section (11/12) 01/12/2012   Vitamin D deficiency     Past Surgical History:  Procedure Laterality Date   CESAREAN SECTION  01/12/2012   Procedure: CESAREAN SECTION;  Surgeon: Serita Kyle, MD;  Location: [facility];  Service: Obstetrics;  Laterality: N/A;  Primary C/S.  EDD: 02/02/12   DILATION AND CURETTAGE OF UTERUS     DILATION AND EVACUATION N/A 03/11/2017   Procedure: SUCTION DILATATION AND EVACUATION;  Surgeon: Jaymes Graff, MD;  Location: [facility];  Service: Gynecology;  Laterality: N/A;   OPERATIVE ULTRASOUND N/A 03/11/2017   Procedure: OPERATIVE ULTRASOUND;  Surgeon: Jaymes Graff, MD;  Location: [facility];  Service: Gynecology;  Laterality: N/A;    Family History  Problem Relation Age of Onset   Diabetes Father    Vision loss Father    Stroke Maternal Aunt    Cancer Paternal Aunt     Cancer Paternal Uncle    Hypertension Maternal Grandmother    Diabetes Maternal Grandmother    Heart disease Maternal Grandmother    Arthritis Maternal Grandmother    Hypertension Paternal Grandmother    Diabetes Paternal Grandmother    Arthritis Paternal Grandmother    Asthma Paternal Grandfather     Social History   Socioeconomic History   Marital status: Single    Spouse name: Not on file   Number of children: Not on file   Years of education: Not on file   Highest education level: Not on file  Occupational History   Not on file  Tobacco Use   Smoking status: Never   Smokeless tobacco: Never  Vaping Use   Vaping Use: Never used  Substance and Sexual Activity   Alcohol use: No   Drug use: No   Sexual activity: Not on file  Other Topics Concern   Not on file  Social History Narrative   Not on file   Social Determinants of Health   Financial Resource Strain: Not on file  Food Insecurity: Not on file  Transportation Needs: Not on file  Physical Activity: Not on file  Stress: Not on file  Social Connections: Not on file  Intimate Partner Violence: Not on file     Physical Exam   Vitals:   11/10/20 2216 11/10/20 2321  BP: 131/77 134/81  Pulse: 79 75  Resp: 18 18  Temp: 98.8  F (37.1 C)   SpO2: 100% 100%    CONSTITUTIONAL: Well-appearing, NAD NEURO:  Alert and oriented x 3, no focal deficits EYES:  eyes equal and reactive ENT/NECK:  no LAD, no JVD CARDIO: Regular rate, well-perfused, normal S1 and S2 PULM:  CTAB no wheezing or rhonchi GI/GU:  normal bowel sounds, non-distended, non-tender MSK/SPINE:  No gross deformities, no edema SKIN:  no rash, atraumatic PSYCH:  Appropriate speech and behavior  *Additional and/or pertinent findings included in MDM below  Diagnostic and Interventional Summary    EKG Interpretation  Date/Time:  Sunday November 10 2020 23:12:38 EDT Ventricular Rate:  66 PR Interval:  134 QRS Duration: 67 QT  Interval:  386 QTC Calculation: 405 R Axis:   69 Text Interpretation: Sinus rhythm Probable left atrial enlargement Confirmed by Delonda Coley ([id number]) on 11/10/2020 11:53:20 PM       Labs Reviewed - No data to display  DG Chest 2 View  Final Result      Medications - No data to display   Procedures  /  Critical Care Procedures  ED Course and Medical Decision Making  I have reviewed the triage vital signs, the nursing notes, and pertinent available records from the EMR.  Listed above are laboratory and imaging tests that I personally ordered, reviewed, and interpreted and then considered in my medical decision making (see below for details).  Symptoms consistent with viral illness, having some chest discomfort with cough, highly doubt cardiopulmonary etiology, will obtain screening EKG and chest x-ray.  Anticipating discharge.       Sarra Rachels M. Alquan Morrish, MD [facility] [email or web address]  Final Clinical Impressions(s) / ED Diagnoses     ICD-10-CM   1. Viral URI with cough  J06.9     2. Cough  R05.9 DG Chest 2 View    DG Chest 2 View      ED Discharge Orders          Ordered    benzonatate (TESSALON) 100 MG capsule  Every 8 hours        09 /11/22 2354             Discharge Instructions Discussed with and Provided to Patient:     Discharge Instructions      You were evaluated in the Emergency Department and after careful evaluation, we did not find any emergent condition requiring admission or further testing in the hospital.  Your exam/testing today was overall reassuring.  EKG and chest x-ray are normal.  Symptoms seem to be due to a viral illness.  Use the Tessalon medication as needed for cough.  Should be feeling better over the next few days.  Please return to the Emergency Department if you experience any worsening of your condition.  Thank you for allowing 04-11-1994 to be a part of your care.         [place], MD 11/10/20 [phone]
